# Patient Record
Sex: Male | Born: 1965 | Race: White | Hispanic: No | Marital: Married | Smoking: Current every day smoker
Health system: Southern US, Community
[De-identification: ages and names within clinical notes are randomized; demographics above are authoritative.]

## PROBLEM LIST (undated history)

## (undated) DIAGNOSIS — E7800 Pure hypercholesterolemia, unspecified: Secondary | ICD-10-CM

## (undated) DIAGNOSIS — F17210 Nicotine dependence, cigarettes, uncomplicated: Secondary | ICD-10-CM

## (undated) DIAGNOSIS — K429 Umbilical hernia without obstruction or gangrene: Secondary | ICD-10-CM

## (undated) DIAGNOSIS — I10 Essential (primary) hypertension: Secondary | ICD-10-CM

## (undated) DIAGNOSIS — Z87442 Personal history of urinary calculi: Secondary | ICD-10-CM

## (undated) HISTORY — PX: TONSILLECTOMY: SUR1361

## (undated) HISTORY — DX: Nicotine dependence, cigarettes, uncomplicated: F17.210

## (undated) HISTORY — DX: Umbilical hernia without obstruction or gangrene: K42.9

---

## 1996-02-27 HISTORY — PX: FLEXIBLE SIGMOIDOSCOPY: SHX1649

## 2014-05-06 DIAGNOSIS — I10 Essential (primary) hypertension: Secondary | ICD-10-CM | POA: Insufficient documentation

## 2014-10-08 DIAGNOSIS — M722 Plantar fascial fibromatosis: Secondary | ICD-10-CM | POA: Insufficient documentation

## 2014-10-08 DIAGNOSIS — M84376A Stress fracture, unspecified foot, initial encounter for fracture: Secondary | ICD-10-CM | POA: Insufficient documentation

## 2015-02-18 ENCOUNTER — Ambulatory Visit
Admission: EM | Admit: 2015-02-18 | Discharge: 2015-02-18 | Payer: 59 | Attending: Family Medicine | Admitting: Family Medicine

## 2015-02-18 ENCOUNTER — Emergency Department
Admission: EM | Admit: 2015-02-18 | Discharge: 2015-02-18 | Disposition: A | Discharge status: Home / Self Care | Payer: 59 | Attending: Emergency Medicine | Admitting: Emergency Medicine

## 2015-02-18 ENCOUNTER — Encounter: Payer: Self-pay | Admitting: Emergency Medicine

## 2015-02-18 ENCOUNTER — Emergency Department: Payer: 59

## 2015-02-18 DIAGNOSIS — I10 Essential (primary) hypertension: Secondary | ICD-10-CM | POA: Insufficient documentation

## 2015-02-18 DIAGNOSIS — S29011A Strain of muscle and tendon of front wall of thorax, initial encounter: Secondary | ICD-10-CM | POA: Insufficient documentation

## 2015-02-18 DIAGNOSIS — X58XXXA Exposure to other specified factors, initial encounter: Secondary | ICD-10-CM | POA: Diagnosis not present

## 2015-02-18 DIAGNOSIS — Z8249 Family history of ischemic heart disease and other diseases of the circulatory system: Secondary | ICD-10-CM | POA: Insufficient documentation

## 2015-02-18 DIAGNOSIS — S29019A Strain of muscle and tendon of unspecified wall of thorax, initial encounter: Secondary | ICD-10-CM

## 2015-02-18 DIAGNOSIS — R071 Chest pain on breathing: Secondary | ICD-10-CM | POA: Diagnosis not present

## 2015-02-18 DIAGNOSIS — R079 Chest pain, unspecified: Secondary | ICD-10-CM | POA: Insufficient documentation

## 2015-02-18 DIAGNOSIS — Z720 Tobacco use: Secondary | ICD-10-CM | POA: Diagnosis not present

## 2015-02-18 DIAGNOSIS — Z7982 Long term (current) use of aspirin: Secondary | ICD-10-CM | POA: Insufficient documentation

## 2015-02-18 DIAGNOSIS — Y998 Other external cause status: Secondary | ICD-10-CM | POA: Insufficient documentation

## 2015-02-18 DIAGNOSIS — Y9389 Activity, other specified: Secondary | ICD-10-CM | POA: Insufficient documentation

## 2015-02-18 DIAGNOSIS — E7800 Pure hypercholesterolemia, unspecified: Secondary | ICD-10-CM | POA: Diagnosis not present

## 2015-02-18 DIAGNOSIS — Y9289 Other specified places as the place of occurrence of the external cause: Secondary | ICD-10-CM | POA: Diagnosis not present

## 2015-02-18 DIAGNOSIS — F17210 Nicotine dependence, cigarettes, uncomplicated: Secondary | ICD-10-CM | POA: Insufficient documentation

## 2015-02-18 DIAGNOSIS — Z79899 Other long term (current) drug therapy: Secondary | ICD-10-CM | POA: Diagnosis not present

## 2015-02-18 DIAGNOSIS — R0789 Other chest pain: Secondary | ICD-10-CM

## 2015-02-18 DIAGNOSIS — S299XXA Unspecified injury of thorax, initial encounter: Secondary | ICD-10-CM | POA: Diagnosis present

## 2015-02-18 HISTORY — DX: Pure hypercholesterolemia, unspecified: E78.00

## 2015-02-18 HISTORY — DX: Essential (primary) hypertension: I10

## 2015-02-18 LAB — BASIC METABOLIC PANEL
ANION GAP: 8 (ref 5–15)
BUN: 10 mg/dL (ref 6–20)
CALCIUM: 9.4 mg/dL (ref 8.9–10.3)
CO2: 26 mmol/L (ref 22–32)
Chloride: 106 mmol/L (ref 101–111)
Creatinine, Ser: 1.08 mg/dL (ref 0.61–1.24)
Glucose, Bld: 121 mg/dL — ABNORMAL HIGH (ref 65–99)
POTASSIUM: 4.3 mmol/L (ref 3.5–5.1)
SODIUM: 140 mmol/L (ref 135–145)

## 2015-02-18 LAB — CBC
HEMATOCRIT: 42.8 % (ref 40.0–52.0)
HEMOGLOBIN: 14.8 g/dL (ref 13.0–18.0)
MCH: 31.8 pg (ref 26.0–34.0)
MCHC: 34.5 g/dL (ref 32.0–36.0)
MCV: 92.3 fL (ref 80.0–100.0)
Platelets: 187 10*3/uL (ref 150–440)
RBC: 4.64 MIL/uL (ref 4.40–5.90)
RDW: 13 % (ref 11.5–14.5)
WBC: 6 10*3/uL (ref 3.8–10.6)

## 2015-02-18 LAB — TROPONIN I

## 2015-02-18 MED ORDER — NITROGLYCERIN 0.4 MG SL SUBL
0.4000 mg | SUBLINGUAL_TABLET | SUBLINGUAL | Status: DC | PRN
  Administered 2015-02-18 (×2): 0.4 mg via SUBLINGUAL

## 2015-02-18 MED ORDER — ALBUTEROL SULFATE HFA 108 (90 BASE) MCG/ACT IN AERS: 2.0000 | INHALATION_SPRAY | Freq: Four times a day (QID) | RESPIRATORY_TRACT | Status: DC | PRN

## 2015-02-18 NOTE — ED Notes (Signed)
Woke at 6am today with left anterior chest pain radiating through to back. Denies diaphoretic, no SOB. Worsening pain with inspiration. Color pink, skin warm and dry. Hx of 1 1/2 PPD smoker x 30 years

## 2015-02-18 NOTE — ED Notes (Signed)
[facility], [facility] was sent [facility]. Patient presented there this morning after having chest pain that started at 6am, patient reports that he was lightheaded when he had the chest pain but did not have any other symptoms. Patient states that the pain is positional and that it hurts when he coughs. Patient states that he has been coughing more for the last 1-2 weeks.   Per EMS, patients vital signs were stable and 12 lead from [facility] was W[facility].

## 2015-02-18 NOTE — ED Provider Notes (Signed)
[facility] Emergency Department Provider Note   ____________________________________________  Time seen:  I have reviewed the triage vital signs and the triage nursing note.  HISTORY  Chief Complaint Chest Pain   Historian Patient  HPI Gavin RomansLyman Jimenez is a 49 y.o. male who is sent here from urgent care where he attempted to seek evaluation for left sided chest and back pain. Chest pain started this morning after he coughed hard. He states he has been coughing recently, and thinks that this may be related to smoking. He is attempting to quit smoking by using Wellbutrin. He thinks he has wheezed at point in the past, however he has never used an inhaler. Pain is moderate in the left back when he coughs especially, and when he takes a deep breath. He's not had a fever. He's had no nausea or vomiting. No palpitations.    Past Medical History  Diagnosis Date  . Hypertension   . Hypercholesteremia     There are no active problems to display for this patient.   Past Surgical History  Procedure Laterality Date  . Tonsillectomy      Current Outpatient Rx  Name  Route  Sig  Dispense  Refill  . aspirin EC 325 MG tablet   Oral   Take 650 mg by mouth once.         Marland Kitchen. buPROPion (WELLBUTRIN SR) 150 MG 12 hr tablet   Oral   Take 150 mg by mouth 2 (two) times daily.         . cholecalciferol (VITAMIN D) 1000 UNITS tablet   Oral   Take 5,000 Units by mouth daily.         Marland Kitchen. lisinopril (PRINIVIL,ZESTRIL) 10 MG tablet   Oral   Take 10 mg by mouth daily.         . pravastatin (PRAVACHOL) 20 MG tablet   Oral   Take 20 mg by mouth daily.         Marland Kitchen. albuterol (PROVENTIL HFA;VENTOLIN HFA) 108 (90 BASE) MCG/ACT inhaler   Inhalation   Inhale 2 puffs into the lungs every 6 (six) hours as needed for wheezing or shortness of breath.   1 Inhaler   0     Allergies Review of patient's allergies indicates no known allergies.  Family History  Problem  Relation Age of Onset  . Heart attack Mother   . Heart attack Father     Social History Social History  Substance Use Topics  . Smoking status: Current Every Day Smoker -- 2.00 packs/day    Types: Cigarettes  . Smokeless tobacco: None  . Alcohol Use: Yes     Comment: 3-4 beers daily    Review of Systems  Constitutional: Negative for fever. Eyes: Negative for visual changes. ENT: Negative for sore throat. Cardiovascular: Negative for palpitations. Respiratory: Negative for shortness of breath. Gastrointestinal: Negative for abdominal pain, vomiting and diarrhea. Genitourinary: Negative for dysuria. Musculoskeletal: Negative for extremity pain. Skin: Negative for rash. Neurological: Negative for headache. 10 point Review of Systems otherwise negative ____________________________________________   PHYSICAL EXAM:  VITAL SIGNS: ED Triage Vitals  Enc Vitals Group     BP 02/18/15 0913 124/79 mmHg     Pulse Rate 02/18/15 0913 78     Resp 02/18/15 0913 15     Temp 02/18/15 0913 98.1 F (36.7 C)     Temp Source 02/18/15 0913 Oral     SpO2 02/18/15 0913 98 %     Weight  02/18/15 0913 198 lb 12.8 oz (90.175 kg)     Height 02/18/15 0913  (1.778 m)     Head Cir --      Peak Flow --      Pain Score 02/18/15 0915 4     Pain Loc --      Pain Edu? --      Excl. in GC? --      Constitutional: Alert and oriented. Well appearing and in no distress. Eyes: Conjunctivae are normal. PERRL. Normal extraocular movements. ENT   Head: Normocephalic and atraumatic.   Nose: No congestion/rhinnorhea.   Mouth/Throat: Mucous membranes are moist.   Neck: No stridor. Cardiovascular/Chest: Normal rate, regular rhythm.  No murmurs, rubs, or gallops. Respiratory: Normal respiratory effort without tachypnea nor retractions. Mildly tight breath sounds, with mild rhonchi posteriorly. Gastrointestinal: Soft. No distention, no guarding, no rebound. Nontender   Genitourinary/rectal:Deferred Musculoskeletal: Nontender with normal range of motion in all extremities. No joint effusions.  No lower extremity tenderness.  No edema. Neurologic:  Normal speech and language. No gross or focal neurologic deficits are appreciated. Skin:  Skin is warm, dry and intact. No rash noted. Psychiatric: Mood and affect are normal. Speech and behavior are normal. Patient exhibits appropriate insight and judgment.  ____________________________________________   EKG I, Governor Rooks, MD, the attending physician have personally viewed and interpreted all ECGs.  74 bpm. Narrow QRS. Normal sinus rhythm. Normal axis. Normal ST and T-wave. ____________________________________________  LABS (pertinent positives/negatives)  Basic metabolic panel within normal limits CBC within normal limits Troponin less than 0.03  ____________________________________________  RADIOLOGY All Xrays were viewed by me. Imaging interpreted by Radiologist.  Chest x-ray portable one view bibasilar atelectasis __________________________________________  PROCEDURES  Procedure(s) performed: None  Critical Care performed: None  ____________________________________________   ED COURSE / ASSESSMENT AND PLAN  CONSULTATIONS: None  Pertinent labs & imaging results that were available during my care of the patient were reviewed by me and considered in my medical decision making (see chart for details).   Patient's symptoms seem most consistent with musculoskeletal low chest/back pain after cough. I suspect a thoracic strain after a strong cough. My suspicion for cardiac etiology is extremely low. His EKG is reassuring as is his laboratory evaluation including a negative troponin drawn greater than 4 hours after the onset of his pain. Given the fact that he is a smoker and has a family history of family members with heart attacks in the 46s to 77s, I will go ahead and refer him to  cardiologist for outpatient follow-up. He does have some minor bronchospasm on exam, I am going to prescribe him an albuterol inhaler.  Patient / Family / Caregiver informed of clinical course, medical decision-making process, and agree with plan.   I discussed return precautions, follow-up instructions, and discharged instructions with patient and/or family.  ___________________________________________   FINAL CLINICAL IMPRESSION(S) / ED DIAGNOSES   Final diagnoses:  Thoracic myofascial strain, initial encounter  Chest wall pain       Governor Rooks, MD 02/18/15 1208

## 2015-02-18 NOTE — ED Provider Notes (Signed)
CSN: [id number]     Arrival date & time 02/18/15  [id number] History   None    Chief Complaint  Patient presents with  . Chest Pain   (Consider location/radiation/quality/duration/timing/severity/associated sxs/prior Treatment) HPI patient presents today with symptoms of left anterior chest pain radiating to the back which started early this morning when he woke up. Patient admits to some dizziness but denies any diaphoresis or shortness of breath or nausea or vomiting. Patient does have history of hypertension and high cholesterol. Patient is a smoker and does drink alcohol daily. He admits to having reflux in the past and that the symptoms that he currently has are not similar. He denies any cardiac history in the past. He does have a strong family history of CAD.  Past Medical History  Diagnosis Date  . Hypertension   . Hypercholesteremia    Past Surgical History  Procedure Laterality Date  . Tonsillectomy     Family History  Problem Relation Age of Onset  . Heart attack Mother   . Heart attack Father    Social History  Substance Use Topics  . Smoking status: Current Every Day Smoker -- 2.00 packs/day    Types: Cigarettes  . Smokeless tobacco: None  . Alcohol Use: Yes     Comment: 3-4 beers daily    Review of Systems: Negative except mentioned above.  Allergies  Review of patient's allergies indicates no known allergies.  Home Medications   Prior to Admission medications   Medication Sig Start Date End Date Taking? Authorizing Provider  aspirin EC 325 MG tablet Take 650 mg by mouth once.   Yes Historical Provider, MD  buPROPion (WELLBUTRIN SR) 150 MG 12 hr tablet Take 150 mg by mouth 2 (two) times daily.   Yes Historical Provider, MD  cholecalciferol (VITAMIN D) 1000 UNITS tablet Take 5,000 Units by mouth daily.   Yes Historical Provider, MD  lisinopril (PRINIVIL,ZESTRIL) 10 MG tablet Take 10 mg by mouth daily.   Yes Historical Provider, MD  pravastatin (PRAVACHOL) 20 MG  tablet Take 20 mg by mouth daily.   Yes Historical Provider, MD   Meds Ordered and Administered this Visit  Medications - No data to display  BP 159/90 mmHg  Pulse 86  Temp(Src) 97.6 F (36.4 C) (Tympanic)  Resp 16  Ht 5\' 10"  (1.778 m)  Wt 203 lb (92.08 kg)  BMI 29.13 kg/m2  SpO2 97% No data found.   Physical Exam   GENERAL: NAD HEENT: no pharyngeal erythema, no exudate RESP: CTA B CARD: RRR, pain not reproducible  ABD: soft, NT/ND EXTREM: -Homans  NEURO: CN II-XII grossly intact   ED Course  Procedures (including critical care time)  Labs Review Labs Reviewed - No data to display  Imaging Review No results found.   ECG shows NSR, no ST elevation or depression, no T wave inversions     MDM   A/P: Chest Pain- ECG shows NSR with no ST elevation/depression or T wave inversions, given patient's symptoms, medical history and family history I would recommend further evaluation and treatment at the ER. Patient took two 325mg  ASA before coming in this morning. An IV was started, O2 and one sublingual nitroglycerin was given to the patient in the office. The one SL Nitro did help the patient's pain. EMS was called for transport.    Jolene ProvostKirtida Nicholos Aloisi, MD 02/18/15 [phone]

## 2015-02-18 NOTE — ED Notes (Signed)
Patient states that he took two 325mg  Aspirin at home [facility].  [facility] he was given 1 SL NTG, per patient NTG did have some relief from the NTG.

## 2015-02-18 NOTE — Discharge Instructions (Signed)
You were evaluated for left-sided chest/back pain which occurred after coughing, and your exam and evaluation are reassuring here in the emergency department. I suspect you pulled a muscle with the cough.  We discussed, that since you have a history of family members with early/young heart attacks, as well as are a smoker, I will recommend you to follow-up with a cardiologist for consideration of further testing such as a stress test.  You are also referred for primary care follow-up within one week, and referred to [facility] clinic.  Return to the emergency department for any new or worsening condition including chest pain, palpitations, dizziness, passing out, left jaw or left arm pain, nausea, sweating, or any other symptoms concerning to you.  Treat musculoskeletal pain with ibuprofen 600 mg over-the-counter every 8 hours as needed for pain.   Chest Wall Pain Chest wall pain is pain in or around the bones and muscles of your chest. Sometimes, an injury causes this pain. Sometimes, the cause may not be known. This pain may take several weeks or longer to get better. HOME CARE Pay attention to any changes in your symptoms. Take these actions to help with your pain:  Rest as told by your doctor.  Avoid activities that cause pain. Try not to use your chest, belly (abdominal), or side muscles to lift heavy things.  If directed, apply ice to the painful area:  Put ice in a plastic bag.  Place a towel between your skin and the bag.  Leave the ice on for 20 minutes, 2-3 times per day.  Take over-the-counter and prescription medicines only as told by your doctor.  Do not use tobacco products, including cigarettes, chewing tobacco, and e-cigarettes. If you need help quitting, ask your doctor.  Keep all follow-up visits as told by your doctor. This is important. GET HELP IF:  You have a fever.  Your chest pain gets worse.  You have new symptoms. GET HELP RIGHT AWAY IF:  You feel  sick to your stomach (nauseous) or you throw up (vomit).  You feel sweaty or light-headed.  You have a cough with phlegm (sputum) or you cough up blood.  You are short of breath.   This information is not intended to replace advice given to you by your health care provider. Make sure you discuss any questions you have with your health care provider.   Document Released: 10/05/2007 Document Revised: 01/07/2015 Document Reviewed: 07/14/2014 Elsevier Interactive Patient Education 2016 [employer]  Nonspecific Chest Pain  Chest pain can be caused by many different conditions. There is always a chance that your pain could be related to something serious, such as a heart attack or a blood clot in your lungs. Chest pain can also be caused by conditions that are not life-threatening. If you have chest pain, it is very important to follow up with your health care provider. CAUSES  Chest pain can be caused by:  Heartburn.  Pneumonia or bronchitis.  Anxiety or stress.  Inflammation around your heart (pericarditis) or lung (pleuritis or pleurisy).  A blood clot in your lung.  A collapsed lung (pneumothorax). It can develop suddenly on its own (spontaneous pneumothorax) or from trauma to the chest.  Shingles infection (varicella-zoster virus).  Heart attack.  Damage to the bones, muscles, and cartilage that make up your chest wall. This can include:  Bruised bones due to injury.  Strained muscles or cartilage due to frequent or repeated coughing or overwork.  Fracture to one or more  ribs.  Sore cartilage due to inflammation (costochondritis). RISK FACTORS  Risk factors for chest pain may include:  Activities that increase your risk for trauma or injury to your chest.  Respiratory infections or conditions that cause frequent coughing.  Medical conditions or overeating that can cause heartburn.  Heart disease or family history of heart disease.  Conditions or health  behaviors that increase your risk of developing a blood clot.  Having had chicken pox (varicella zoster). SIGNS AND SYMPTOMS Chest pain can feel like:  Burning or tingling on the surface of your chest or deep in your chest.  Crushing, pressure, aching, or squeezing pain.  Dull or sharp pain that is worse when you move, cough, or take a deep breath.  Pain that is also felt in your back, neck, shoulder, or arm, or pain that spreads to any of these areas. Your chest pain may come and go, or it may stay constant. DIAGNOSIS Lab tests or other studies may be needed to find the cause of your pain. Your health care provider may have you take a test called an ambulatory ECG (electrocardiogram). An ECG records your heartbeat patterns at the time the test is performed. You may also have other tests, such as:  Transthoracic echocardiogram (TTE). During echocardiography, sound waves are used to create a picture of all of the heart structures and to look at how blood flows through your heart.  Transesophageal echocardiogram (TEE).This is a more advanced imaging test that obtains images from inside your body. It allows your health care provider to see your heart in finer detail.  Cardiac monitoring. This allows your health care provider to monitor your heart rate and rhythm in real time.  Holter monitor. This is a portable device that records your heartbeat and can help to diagnose abnormal heartbeats. It allows your health care provider to track your heart activity for several days, if needed.  Stress tests. These can be done through exercise or by taking medicine that makes your heart beat more quickly.  Blood tests.  Imaging tests. TREATMENT  Your treatment depends on what is causing your chest pain. Treatment may include:  Medicines. These may include:  Acid blockers for heartburn.  Anti-inflammatory medicine.  Pain medicine for inflammatory conditions.  Antibiotic medicine, if an  infection is present.  Medicines to dissolve blood clots.  Medicines to treat coronary artery disease.  Supportive care for conditions that do not require medicines. This may include:  Resting.  Applying heat or cold packs to injured areas.  Limiting activities until pain decreases. HOME CARE INSTRUCTIONS  If you were prescribed an antibiotic medicine, finish it all even if you start to feel better.  Avoid any activities that bring on chest pain.  Do not use any tobacco products, including cigarettes, chewing tobacco, or electronic cigarettes. If you need help quitting, ask your health care provider.  Do not drink alcohol.  Take medicines only as directed by your health care provider.  Keep all follow-up visits as directed by your health care provider. This is important. This includes any further testing if your chest pain does not go away.  If heartburn is the cause for your chest pain, you may be told to keep your head raised (elevated) while sleeping. This reduces the chance that acid will go from your stomach into your esophagus.  Make lifestyle changes as directed by your health care provider. These may include:  Getting regular exercise. Ask your health care provider to suggest some activities  that are safe for you.  Eating a heart-healthy diet. A registered dietitian can help you to learn healthy eating options.  Maintaining a healthy weight.  Managing diabetes, if necessary.  Reducing stress. SEEK MEDICAL CARE IF:  Your chest pain does not go away after treatment.  You have a rash with blisters on your chest.  You have a fever. SEEK IMMEDIATE MEDICAL CARE IF:   Your chest pain is worse.  You have an increasing cough, or you cough up blood.  You have severe abdominal pain.  You have severe weakness.  You faint.  You have chills.  You have sudden, unexplained chest discomfort.  You have sudden, unexplained discomfort in your arms, back, neck, or  jaw.  You have shortness of breath at any time.  You suddenly start to sweat, or your skin gets clammy.  You feel nauseous or you vomit.  You suddenly feel light-headed or dizzy.  Your heart begins to beat quickly, or it feels like it is skipping beats. These symptoms may represent a serious problem that is an emergency. Do not wait to see if the symptoms will go away. Get medical help right away. Call your local emergency services (911 in the [place] Do not drive yourself to the hospital.   This information is not intended to replace advice given to you by your health care provider. Make sure you discuss any questions you have with your health care provider.   Document Released: 01/26/2005 Document Revised: 05/09/2014 Document Reviewed: 11/22/2013 Elsevier Interactive Patient Education [employer]

## 2015-07-16 DIAGNOSIS — F17210 Nicotine dependence, cigarettes, uncomplicated: Secondary | ICD-10-CM | POA: Insufficient documentation

## 2015-07-16 DIAGNOSIS — E7800 Pure hypercholesterolemia, unspecified: Secondary | ICD-10-CM | POA: Insufficient documentation

## 2016-08-24 DIAGNOSIS — N522 Drug-induced erectile dysfunction: Secondary | ICD-10-CM | POA: Insufficient documentation

## 2016-11-30 ENCOUNTER — Other Ambulatory Visit: Payer: Self-pay

## 2016-11-30 DIAGNOSIS — K4020 Bilateral inguinal hernia, without obstruction or gangrene, not specified as recurrent: Secondary | ICD-10-CM | POA: Insufficient documentation

## 2016-12-01 ENCOUNTER — Ambulatory Visit: Payer: 59 | Admitting: Surgery

## 2016-12-12 ENCOUNTER — Ambulatory Visit (INDEPENDENT_AMBULATORY_CARE_PROVIDER_SITE_OTHER): Payer: 59 | Admitting: Surgery

## 2016-12-12 ENCOUNTER — Encounter: Payer: Self-pay | Admitting: Surgery

## 2016-12-12 VITALS — BP 178/107 | HR 69 | Temp 98.5°F | Ht 70.0 in | Wt 199.4 lb

## 2016-12-12 DIAGNOSIS — K429 Umbilical hernia without obstruction or gangrene: Secondary | ICD-10-CM | POA: Diagnosis not present

## 2016-12-12 NOTE — Patient Instructions (Signed)
You have seen Dr. Excell Seltzerooper today in regards to your umbilical hernia.  Please call our office if you would like to have this repaired in the future.  If you decide to have your hernia repaired, You will need to arrange to be off work for 1-2 weeks but will have to have a lifting restriction of no more than 15 lbs for 6 weeks following your surgery.   Umbilical Hernia, Adult A hernia is a bulge of tissue that pushes through an opening between muscles. An umbilical hernia happens in the abdomen, near the belly button (umbilicus). The hernia may contain tissues from the small intestine, large intestine, or fatty tissue covering the intestines (omentum). Umbilical hernias in adults tend to get worse over time, and they require surgical treatment. There are several types of umbilical hernias. You may have:  A hernia located just above or below the umbilicus (indirect hernia). This is the most common type of umbilical hernia in adults.  A hernia that forms through an opening formed by the umbilicus (direct hernia).  A hernia that comes and goes (reducible hernia). A reducible hernia may be visible only when you strain, lift something heavy, or cough. This type of hernia can be pushed back into the abdomen (reduced).  A hernia that traps abdominal tissue inside the hernia (incarcerated hernia). This type of hernia cannot be reduced.  A hernia that cuts off blood flow to the tissues inside the hernia (strangulated hernia). The tissues can start to die if this happens. This type of hernia requires emergency treatment.  What are the causes? An umbilical hernia happens when tissue inside the abdomen presses on a weak area of the abdominal muscles. What increases the risk? You may have a greater risk of this condition if you:  Are obese.  Have had several pregnancies.  Have a buildup of fluid inside your abdomen (ascites).  Have had surgery that weakens the abdominal muscles.  What are the signs  or symptoms? The main symptom of this condition is a painless bulge at or near the belly button. A reducible hernia may be visible only when you strain, lift something heavy, or cough. Other symptoms may include:  Dull pain.  A feeling of pressure.  Symptoms of a strangulated hernia may include:  Pain that gets increasingly worse.  Nausea and vomiting.  Pain when pressing on the hernia.  Skin over the hernia becoming red or purple.  Constipation.  Blood in the stool.  How is this diagnosed? This condition may be diagnosed based on:  A physical exam. You may be asked to cough or strain while standing. These actions increase the pressure inside your abdomen and force the hernia through the opening in your muscles. Your health care provider may try to reduce the hernia by pressing on it.  Your symptoms and medical history.  How is this treated? Surgery is the only treatment for an umbilical hernia. Surgery for a strangulated hernia is done as soon as possible. If you have a small hernia that is not incarcerated, you may need to lose weight before having surgery. Follow these instructions at home:  Lose weight, if told by your health care provider.  Do not try to push the hernia back in.  Watch your hernia for any changes in color or size. Tell your health care provider if any changes occur.  You may need to avoid activities that increase pressure on your hernia.  Do not lift anything that is heavier than 10  lb (4.5 kg) until your health care provider says that this is safe.  Take over-the-counter and prescription medicines only as told by your health care provider.  Keep all follow-up visits as told by your health care provider. This is important. Contact a health care provider if:  Your hernia gets larger.  Your hernia becomes painful. Get help right away if:  You develop sudden, severe pain near the area of your hernia.  You have pain as well as nausea or  vomiting.  You have pain and the skin over your hernia changes color.  You develop a fever. This information is not intended to replace advice given to you by your health care provider. Make sure you discuss any questions you have with your health care provider. Document Released: 09/18/2015 Document Revised: 12/20/2015 Document Reviewed: 09/18/2015 Elsevier Interactive Patient Education  [employer].

## 2016-12-12 NOTE — Progress Notes (Signed)
Gavin Jimenez is an 51 y.o. male.   Chief Complaint: Umb hernia HP this patient with an umbilical hernia has been bothering him for about 6 months but does not cause him much pain he's never had nausea vomiting or signs of incarceration. Denies fevers or chills.  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 Hypercholesteremia   . Hypertension     Past Surgical History:  Procedure Laterality Date  . FLEXIBLE SIGMOIDOSCOPY  02/27/1996  . TONSILLECTOMY      Family History  Problem Relation Age of Onset  . Heart attack Mother   . Diabetes Mellitus II Mother   . Osteoarthritis Mother   . Obesity Mother   . Heart attack Father   . CAD Father   . Alcohol abuse Father   . Hyperlipidemia Father   . Hypertension Father   . Osteoarthritis Father   . Hyperlipidemia Maternal Grandmother   . Hypertension Maternal Grandmother   . Obesity Maternal Grandmother   . CAD Maternal Grandmother   . Glaucoma Paternal Grandfather    Social History:  reports that he has been smoking Cigarettes.  He has been smoking about 2.00 packs per day. He has never used smokeless tobacco. He reports that he drinks alcohol. He reports that he does not use drugs.  Allergies:  Allergies  Allergen Reactions  . Nickel Rash     (Not in a hospital admission)   Review of Systems:   Review of Systems  Constitutional: Negative.   HENT: Negative.   Eyes: Negative.   Respiratory: Negative.   Cardiovascular: Negative.   Gastrointestinal: Negative for abdominal pain, constipation, diarrhea, heartburn, nausea and vomiting.  Genitourinary: Negative.   Musculoskeletal: Negative.   Skin: Negative.   Neurological: Negative.    Endo/Heme/Allergies: Negative.   Psychiatric/Behavioral: Negative.     Physical Exam:  Physical Exam  Constitutional: He is oriented to person, place, and time and well-developed, well-nourished, and in no distress. No distress.  HENT:  Head: Normocephalic and atraumatic.  Eyes: Pupils are equal, round, and reactive to light. Right eye exhibits no discharge. Left eye exhibits no discharge. No scleral icterus.  Neck: Normal range of motion.  Cardiovascular: Normal rate, regular rhythm and normal heart sounds.   Pulmonary/Chest: Effort normal and breath sounds normal. No respiratory distress. He has no wheezes. He has no rales.  Abdominal: Soft. He exhibits no distension. There is no tenderness. There is no rebound.  Reducible nontender umbilical hernia  Musculoskeletal: Normal range of motion. He exhibits no edema or tenderness.  Lymphadenopathy:    He has no cervical adenopathy.  Neurological: He is alert and oriented to person, place, and time.  Skin: Skin is warm and dry. No rash noted. He is not diaphoretic. No erythema.  Psychiatric: Mood and affect normal.  Vitals reviewed.   There were no vitals taken for this visit.    No results found for this or any previous visit (from the past 48 hour(s)). No results found.   Assessment/Plan Small umbilical hernia which is reducible and nontender would recommend repair with mesh. I discussed with him the options of observation rationale for repair likely with mesh and the risk of bleeding infection recurrence  cosmetic deformity as well as infected mesh . We discussed these risks in detail and the options. The patient had multiple questions about mesh apparently his mother had a fistula with a mesh repair at one point. He wants to do some further research on this and has not yet decided if he wants to have surgery. I reminded him of the risk of incarceration or emergency surgery and he will call us with his decision      Gavin Jimenez Gavin  [name], MD, FACS

## 2017-01-02 IMAGING — CR DG CHEST 1V PORT
1 series · 1 of 1 positions shown · non-contrast
Comparison: None.

CLINICAL DATA: Chest pain beginning this morning.  Lightheadedness.

EXAM:
PORTABLE CHEST 1 VIEW

[ap]
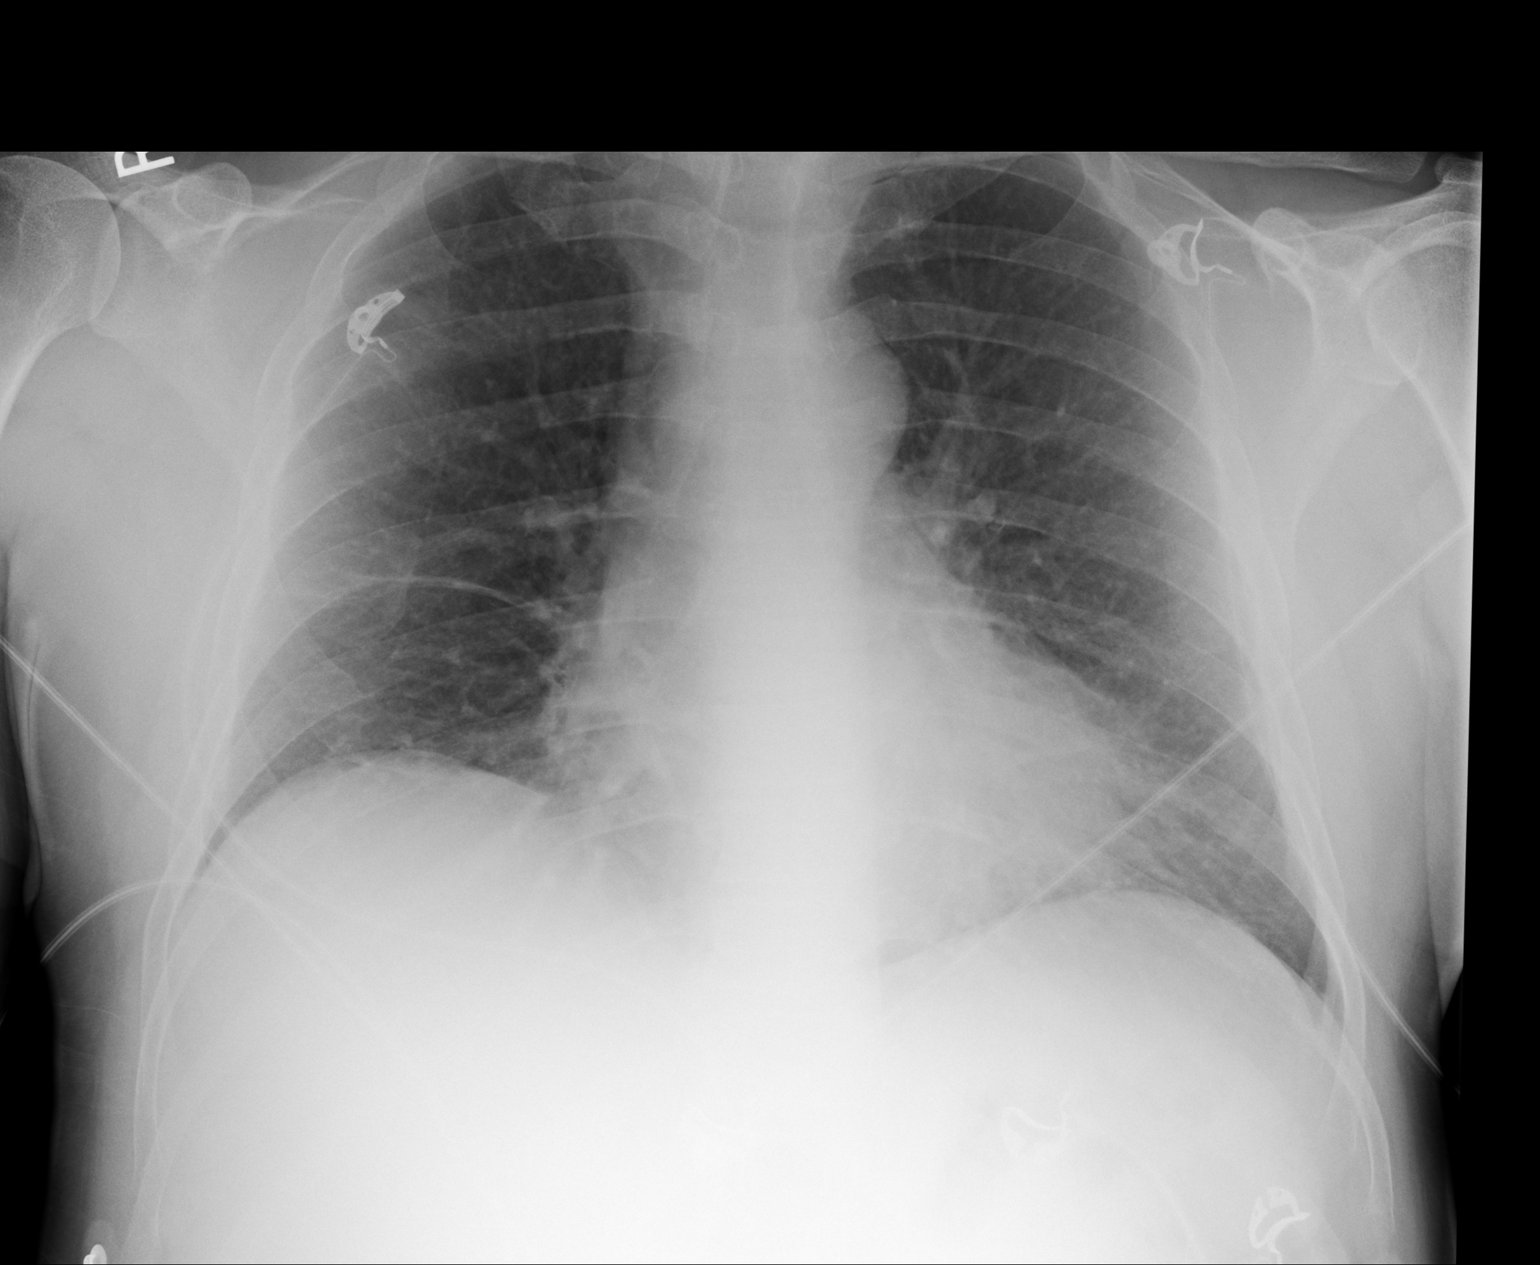

[1 of 1 positions shown; findings below may reference images not displayed]

FINDINGS: There is bibasilar atelectasis. Heart is normal size. No effusions.
No acute bony abnormality.
IMPRESSION: Bibasilar atelectasis.

## 2019-05-03 HISTORY — PX: COLONOSCOPY: SHX174

## 2021-03-31 ENCOUNTER — Ambulatory Visit: Payer: Self-pay | Admitting: Surgery

## 2021-04-05 ENCOUNTER — Encounter: Payer: Self-pay | Admitting: Surgery

## 2021-04-05 ENCOUNTER — Ambulatory Visit (INDEPENDENT_AMBULATORY_CARE_PROVIDER_SITE_OTHER): Payer: Commercial Managed Care - PPO | Admitting: Surgery

## 2021-04-05 ENCOUNTER — Other Ambulatory Visit: Payer: Self-pay

## 2021-04-05 VITALS — BP 137/76 | HR 94 | Temp 98.7°F | Ht 69.0 in | Wt 202.0 lb

## 2021-04-05 DIAGNOSIS — K420 Umbilical hernia with obstruction, without gangrene: Secondary | ICD-10-CM

## 2021-04-05 NOTE — Progress Notes (Signed)
04/05/2021  Reason for Visit:  Umbilical hernia  Requesting Provider:  Barbaraann Boys, MD  History of Present Illness: Gavin Jimenez is a 55 y.o. male presenting for evaluation of an umbilical hernia.  The patient was previously seen by Dr. Burt Knack in 11/2016 for the same, but no surgery was scheduled.  Patient reports that over the past few years he has noted that perhaps the hernia is larger in size, but now it's also not reducible.  Previously he was able to push the hernia back inside himself, but now it stays bulging.  Denies any significant discomfort or symptoms from it, but he would like to proceed with surgery this time.  He mentioned that his mother had a hernia repair with mesh as well in the past, and she had a complication of a fistula.  He reports that she had had multiple surgeries prior to it.  On the other hand, he has not had any abdominal surgeries in the past.  Past Medical History: Past Medical History:  Diagnosis Date   Cigarette smoker    Hypercholesteremia    Hypertension    Umbilical hernia      Past Surgical History: Past Surgical History:  Procedure Laterality Date   FLEXIBLE SIGMOIDOSCOPY  02/27/1996   TONSILLECTOMY      Home Medications: Prior to Admission medications   Medication Sig Start Date End Date Taking? Authorizing Provider  amLODipine (NORVASC) 5 MG tablet Take 1 tablet by mouth 1 day or 1 dose. 08/24/16 08/24/17  [provider]    Allergies: Allergies  Allergen Reactions   Ace Inhibitors Cough   Nickel Rash    Social History:  reports that he has been smoking cigarettes. He has been smoking an average of 2 packs per day. He has never used smokeless tobacco. He reports current alcohol use. He reports that he does not use drugs.   Family History: Family History  Problem Relation Age of Onset   Heart attack Mother    Diabetes Mellitus II Mother    Osteoarthritis Mother    Obesity Mother    Heart attack Father    CAD Father     Alcohol abuse Father    Hyperlipidemia Father    Hypertension Father    Osteoarthritis Father    Hyperlipidemia Maternal Grandmother    Hypertension Maternal Grandmother    Obesity Maternal Grandmother    CAD Maternal Grandmother    Glaucoma Paternal Grandfather     Review of Systems: Review of Systems  Constitutional:  Negative for chills and fever.  HENT:  Negative for hearing loss.   Respiratory:  Negative for shortness of breath.   Cardiovascular:  Negative for chest pain.  Gastrointestinal:  Negative for abdominal pain, constipation, diarrhea, nausea and vomiting.  Genitourinary:  Negative for dysuria.  Musculoskeletal:  Negative for myalgias.  Skin:  Negative for rash.  Neurological:  Negative for dizziness.  Psychiatric/Behavioral:  Negative for depression.    Physical Exam BP 137/76   Pulse 94   Temp 98.7 F (37.1 C)   Ht 5' 9"  (1.753 m)   Wt 202 lb (91.6 kg)   SpO2 97%   BMI 29.83 kg/m  CONSTITUTIONAL: No acute distress, well nourished. HEENT:  Normocephalic, atraumatic, extraocular motion intact. NECK: Trachea is midline, and there is no jugular venous distension.  RESPIRATORY:  Lungs are clear, and breath sounds are equal bilaterally. Normal respiratory effort without pathologic use of accessory muscles. CARDIOVASCULAR: Heart is regular without murmurs, gallops, or rubs. GI:  The abdomen is soft, non-distended, non-tender to palpation.  The patient has an incarcerated, small umbilical hernia, without significant discomfort with deep palpation.  No overlying skin ulceration. MUSCULOSKELETAL:  Normal muscle strength and tone in all four extremities.  No peripheral edema or cyanosis. SKIN: Skin turgor is normal. There are no pathologic skin lesions.  NEUROLOGIC:  Motor and sensation is grossly normal.  Cranial nerves are grossly intact. PSYCH:  Alert and oriented to person, place and time. Affect is normal.  Laboratory Analysis: Labs from 01/19/21: Na 139, K 4.6,  Cl 105, CO2 24, BUN 20, Cr 1.2.  Total bili 0.7, AST 26, ALT 36, Alk phos 40, albumin 4.2.    Imaging: No results found.  Assessment and Plan: This is a 55 y.o. male with now an incarcerated umbilical hernia.  --Discussed with the patient the different categories for hernias ranging from reducible to incarcerated to strangulated.  On his last appointment with Dr. Burt Knack in 2018, it was a reducible hernia, but today it's incarcerated.  He does not have any significant symptoms.  Nonetheless, I discussed with him the recommendation for surgery to prevent any further progression.  He's in agreement.  Discussed with him the planned surgery for a robotic assisted umbilical hernia repair, and reviewed the surgery at length with him including risks of bleeding, infection, injury to surrounding structures, that this is an outpatient procedure, post-op recovery and activity restrictions, and he's willing to proceed. --Will schedule him for 05/20/21.  Will also schedule a follow up early January 2023 for H&P update.  I spent 80 minutes dedicated to the care of this patient on the date of this encounter to include pre-visit review of records, face-to-face time with the patient discussing diagnosis and management, and any post-visit coordination of care.   Melvyn Neth, [facility]

## 2021-04-05 NOTE — Patient Instructions (Addendum)
You have requested for your Umbilical Hernia be repaired. This will be scheduled with Dr. Aleen Campi at [facility].  Please see your (blue)pre-care sheet for information. Our surgery scheduler will call you to look at surgery dates and to go over information.   We will have you do a pre op exam a few weeks before surgery.   You will need to arrange to be off work for 1-2 weeks but will have to have a lifting restriction of no more than 15 lbs for 6 weeks following your surgery.  Umbilical Hernia, Adult A hernia is a bulge of tissue that pushes through an opening between muscles. An umbilical hernia happens in the abdomen, near the belly button (umbilicus). The hernia may contain tissues from the small intestine, large intestine, or fatty tissue covering the intestines. Umbilical hernias in adults tend to get worse over time, and they require surgical treatment. There are different types of umbilical hernias, including: Indirect hernia. This type is located just above or below the umbilicus. It is the most common type of umbilical hernia in adults. Direct hernia. This type forms through an opening formed by the umbilicus. Reducible hernia. This type of hernia comes and goes. It may be visible only when you strain, lift something heavy, or cough. This type of hernia can be pushed back into the abdomen (reduced). Incarcerated hernia. This type traps abdominal tissue inside the hernia. This type of hernia cannot be reduced. Strangulated hernia. This type of hernia cuts off blood flow to the tissues inside the hernia. The tissues can start to die if this happens. This type of hernia requires emergency treatment.   Umbilical Hernia, Adult A hernia is a bulge of tissue that pushes through an opening between muscles. An umbilical hernia happens in the abdomen, near the belly button (umbilicus). The hernia may contain tissues from the small intestine, large intestine, or fatty tissue covering the  intestines (omentum). Umbilical hernias in adults tend to get worse over time, and they require surgical treatment. There are several types of umbilical hernias. You may have: A hernia located just above or below the umbilicus (indirect hernia). This is the most common type of umbilical hernia in adults. A hernia that forms through an opening formed by the umbilicus (direct hernia). A hernia that comes and goes (reducible hernia). A reducible hernia may be visible only when you strain, lift something heavy, or cough. This type of hernia can be pushed back into the abdomen (reduced). A hernia that traps abdominal tissue inside the hernia (incarcerated hernia). This type of hernia cannot be reduced. A hernia that cuts off blood flow to the tissues inside the hernia (strangulated hernia). The tissues can start to die if this happens. This type of hernia requires emergency treatment.  What are the causes? An umbilical hernia happens when tissue inside the abdomen presses on a weak area of the abdominal muscles. What increases the risk? You may have a greater risk of this condition if you: Are obese. Have had several pregnancies. Have a buildup of fluid inside your abdomen (ascites). Have had surgery that weakens the abdominal muscles.  What are the signs or symptoms? The main symptom of this condition is a painless bulge at or near the belly button. A reducible hernia may be visible only when you strain, lift something heavy, or cough. Other symptoms may include: Dull pain. A feeling of pressure.  Symptoms of a strangulated hernia may include: Pain that gets increasingly worse. Nausea and vomiting.  Pain when pressing on the hernia. Skin over the hernia becoming red or purple. Constipation. Blood in the stool.  How is this diagnosed? This condition may be diagnosed based on: A physical exam. You may be asked to cough or strain while standing. These actions increase the pressure inside your  abdomen and force the hernia through the opening in your muscles. Your health care provider may try to reduce the hernia by pressing on it. Your symptoms and medical history.  How is this treated? Surgery is the only treatment for an umbilical hernia. Surgery for a strangulated hernia is done as soon as possible. If you have a small hernia that is not incarcerated, you may need to lose weight before having surgery. Follow these instructions at home: Lose weight, if told by your health care provider. Do not try to push the hernia back in. Watch your hernia for any changes in color or size. Tell your health care provider if any changes occur. You may need to avoid activities that increase pressure on your hernia. Do not lift anything that is heavier than 10 lb (4.5 kg) until your health care provider says that this is safe. Take over-the-counter and prescription medicines only as told by your health care provider. Keep all follow-up visits as told by your health care provider. This is important. Contact a health care provider if: Your hernia gets larger. Your hernia becomes painful. Get help right away if: You develop sudden, severe pain near the area of your hernia. You have pain as well as nausea or vomiting. You have pain and the skin over your hernia changes color. You develop a fever. This information is not intended to replace advice given to you by your health care provider. Make sure you discuss any questions you have with your health care provider. Document Released: 09/18/2015 Document Revised: 12/20/2015 Document Reviewed: 09/18/2015 Elsevier Interactive Patient Education  [employer].

## 2021-04-06 ENCOUNTER — Telehealth: Payer: Self-pay | Admitting: Surgery

## 2021-04-06 NOTE — Telephone Encounter (Signed)
Outgoing call is made, left message for patient to call.  Please inform patient of the following information regarding his surgery:   Pre-Admission date/time, COVID Testing date and Surgery date.  Surgery Date: 05/20/21 Preadmission Testing Date: 05/11/21 (phone 8a-1p) Covid Testing Date: Not needed.    Also patient will need to call at [phone], between 1-3:00pm the day before surgery, to find out what time to arrive for surgery.

## 2021-04-07 NOTE — Telephone Encounter (Signed)
Left another message for patient to call me so that I can give him surgery info.

## 2021-04-09 NOTE — Telephone Encounter (Signed)
Left another message for patient to call.  No return call to date.  Surgery information is mailed to the patient.

## 2021-05-10 ENCOUNTER — Other Ambulatory Visit: Payer: Self-pay

## 2021-05-10 ENCOUNTER — Ambulatory Visit (INDEPENDENT_AMBULATORY_CARE_PROVIDER_SITE_OTHER): Payer: Commercial Managed Care - PPO | Admitting: Surgery

## 2021-05-10 ENCOUNTER — Encounter: Payer: Self-pay | Admitting: Surgery

## 2021-05-10 VITALS — BP 158/91 | HR 93 | Temp 97.8°F | Ht 69.0 in | Wt 207.0 lb

## 2021-05-10 DIAGNOSIS — K420 Umbilical hernia with obstruction, without gangrene: Secondary | ICD-10-CM | POA: Diagnosis not present

## 2021-05-10 NOTE — Progress Notes (Signed)
°  05/10/2021  History of Present Illness: Gavin Jimenez is a 56 y.o. male presenting for H&P update in preparation for surgery.  He is scheduled for a robotic assisted incarcerated umbilical hernia repair on 05/20/21.  He denies any new or worsening symptoms.  His hernia remains incarcerated but without any pain or discomfort.  Denies any chest pain, shortness of breath, nausea, vomiting, constipation, diarrhea.  Past Medical History: Past Medical History:  Diagnosis Date   Cigarette smoker    Hypercholesteremia    Hypertension    Umbilical hernia      Past Surgical History: Past Surgical History:  Procedure Laterality Date   FLEXIBLE SIGMOIDOSCOPY  02/27/1996   TONSILLECTOMY      Home Medications: Prior to Admission medications   Medication Sig Start Date End Date Taking? Authorizing Provider  amLODipine (NORVASC) 10 MG tablet Take 10 mg by mouth daily. 01/21/21  Yes [provider]  buPROPion (WELLBUTRIN SR) 150 MG 12 hr tablet Take 150 mg by mouth 2 (two) times daily. 12/09/20  Yes [provider]  simvastatin (ZOCOR) 20 MG tablet Take 40 mg by mouth at bedtime. 03/26/21  Yes [provider]  valsartan (DIOVAN) 80 MG tablet Take 80 mg by mouth daily. 01/26/21  Yes [provider]    Allergies: Allergies  Allergen Reactions   Ace Inhibitors Cough   Nickel Rash    Review of Systems: Review of Systems  Constitutional:  Negative for chills and fever.  Respiratory:  Negative for shortness of breath.   Cardiovascular:  Negative for chest pain.  Gastrointestinal:  Negative for abdominal pain, nausea and vomiting.   Physical Exam BP (!) 158/91    Pulse 93    Temp 97.8 F (36.6 C)    Ht 5\' 9"  (1.753 m)    Wt 207 lb (93.9 kg)    SpO2 98%    BMI 30.57 kg/m  CONSTITUTIONAL: No acute distress, well nourished. HEENT:  Normocephalic, atraumatic, extraocular motion intact. RESPIRATORY:  Normal respiratory effort without pathologic use of accessory  muscles. CARDIOVASCULAR: Regular rhythm and rate. GI: The abdomen is soft, non-distended, non-tender to palpation.  The patient has an incarcerated umbilical hernia, no tenderness when trying to push on it.  No overlying skin changes.  Defect is probably about 2 cm in size.  NEUROLOGIC:  Motor and sensation is grossly normal.  Cranial nerves are grossly intact. PSYCH:  Alert and oriented to person, place and time. Affect is normal.  Labs/Imaging: None new  Assessment and Plan: This is a 56 y.o. male with an incarcerated umbilical hernia  --Patient is scheduled for a robotic assisted incarcerated umbilical hernia repair on 05/20/21.  There are no new findings and no new medical issues.  Discussed with him the surgery at length again and all of his questions have been answered. --He is scheduled for preop testing tomorrow.  I spent 15 minutes dedicated to the care of this patient on the date of this encounter to include pre-visit review of records, face-to-face time with the patient discussing diagnosis and management, and any post-visit coordination of care.   05/22/21, MD [facility]

## 2021-05-10 NOTE — H&P (View-Only) (Signed)
°  05/10/2021 ° °History of Present Illness: °Gavin Jimenez is a 56 y.o. male presenting for H&P update in preparation for surgery.  He is scheduled for a robotic assisted incarcerated umbilical hernia repair on 05/20/21.  He denies any new or worsening symptoms.  His hernia remains incarcerated but without any pain or discomfort.  Denies any chest pain, shortness of breath, nausea, vomiting, constipation, diarrhea. ° °Past Medical History: °Past Medical History:  °Diagnosis Date  ° Cigarette smoker   ° Hypercholesteremia   ° Hypertension   ° Umbilical hernia   °  ° °Past Surgical History: °Past Surgical History:  °Procedure Laterality Date  ° FLEXIBLE SIGMOIDOSCOPY  02/27/1996  ° TONSILLECTOMY    ° ° °Home Medications: °Prior to Admission medications   °Medication Sig Start Date End Date Taking? Authorizing Provider  °amLODipine (NORVASC) 10 MG tablet Take 10 mg by mouth daily. 01/21/21  Yes [provider]  °buPROPion (WELLBUTRIN SR) 150 MG 12 hr tablet Take 150 mg by mouth 2 (two) times daily. 12/09/20  Yes [provider]  °simvastatin (ZOCOR) 20 MG tablet Take 40 mg by mouth at bedtime. 03/26/21  Yes [provider]  °valsartan (DIOVAN) 80 MG tablet Take 80 mg by mouth daily. 01/26/21  Yes [provider]  ° ° °Allergies: °Allergies  °Allergen Reactions  ° Ace Inhibitors Cough  ° Nickel Rash  ° ° °Review of Systems: °Review of Systems  °Constitutional:  Negative for chills and fever.  °Respiratory:  Negative for shortness of breath.   °Cardiovascular:  Negative for chest pain.  °Gastrointestinal:  Negative for abdominal pain, nausea and vomiting.  ° °Physical Exam °BP (!) 158/91    Pulse 93    Temp 97.8 °F (36.6 °C)    Ht 5' 9" (1.753 m)    Wt 207 lb (93.9 kg)    SpO2 98%    BMI 30.57 kg/m²  °CONSTITUTIONAL: No acute distress, well nourished. °HEENT:  Normocephalic, atraumatic, extraocular motion intact. °RESPIRATORY:  Normal respiratory effort without pathologic use of accessory  muscles. °CARDIOVASCULAR: Regular rhythm and rate. °GI: The abdomen is soft, non-distended, non-tender to palpation.  The patient has an incarcerated umbilical hernia, no tenderness when trying to push on it.  No overlying skin changes.  Defect is probably about 2 cm in size.  °NEUROLOGIC:  Motor and sensation is grossly normal.  Cranial nerves are grossly intact. °PSYCH:  Alert and oriented to person, place and time. Affect is normal. ° °Labs/Imaging: °None new ° °Assessment and Plan: °This is a 56 y.o. male with an incarcerated umbilical hernia ° °--Patient is scheduled for a robotic assisted incarcerated umbilical hernia repair on 05/20/21.  There are no new findings and no new medical issues.  Discussed with him the surgery at length again and all of his questions have been answered. °--He is scheduled for preop testing tomorrow. ° °I spent 15 minutes dedicated to the care of this patient on the date of this encounter to include pre-visit review of records, face-to-face time with the patient discussing diagnosis and management, and any post-visit coordination of care. ° ° °Penelope Fittro Luis Trejon Duford, MD °[facility] ° ° °  °

## 2021-05-10 NOTE — Patient Instructions (Addendum)
Pre-Admission date/time, COVID Testing date and Surgery date.   Surgery Date: 05/20/21 Preadmission Testing Date: 05/11/21 (phone 8a-1p) Covid Testing Date: Not needed.     Also patient will need to call at [phone], between 1-3:00pm the day before surgery, to find out what time to arrive for surgery.  You have requested for your Umbilical Hernia be repaired. This has been scheduled on 05/20/21 by Dr. Aleen Campi at [facility]   You will need to arrange to be off work for 1-2 weeks but will have to have a lifting restriction of no more than 15 lbs for 6 weeks following your surgery.   Umbilical Hernia, Adult A hernia is a bulge of tissue that pushes through an opening between muscles. An umbilical hernia happens in the abdomen, near the belly button (umbilicus). The hernia may contain tissues from the small intestine, large intestine, or fatty tissue covering the intestines (omentum). Umbilical hernias in adults tend to get worse over time, and they require surgical treatment. There are several types of umbilical hernias. You may have: A hernia located just above or below the umbilicus (indirect hernia). This is the most common type of umbilical hernia in adults. A hernia that forms through an opening formed by the umbilicus (direct hernia). A hernia that comes and goes (reducible hernia). A reducible hernia may be visible only when you strain, lift something heavy, or cough. This type of hernia can be pushed back into the abdomen (reduced). A hernia that traps abdominal tissue inside the hernia (incarcerated hernia). This type of hernia cannot be reduced. A hernia that cuts off blood flow to the tissues inside the hernia (strangulated hernia). The tissues can start to die if this happens. This type of hernia requires emergency treatment.  What are the causes? An umbilical hernia happens when tissue inside the abdomen presses on a weak area of the abdominal muscles. What increases the  risk? You may have a greater risk of this condition if you: Are obese. Have had several pregnancies. Have a buildup of fluid inside your abdomen (ascites). Have had surgery that weakens the abdominal muscles.  What are the signs or symptoms? The main symptom of this condition is a painless bulge at or near the belly button. A reducible hernia may be visible only when you strain, lift something heavy, or cough. Other symptoms may include: Dull pain. A feeling of pressure.  Symptoms of a strangulated hernia may include: Pain that gets increasingly worse. Nausea and vomiting. Pain when pressing on the hernia. Skin over the hernia becoming red or purple. Constipation. Blood in the stool.  How is this diagnosed? This condition may be diagnosed based on: A physical exam. You may be asked to cough or strain while standing. These actions increase the pressure inside your abdomen and force the hernia through the opening in your muscles. Your health care provider may try to reduce the hernia by pressing on it. Your symptoms and medical history.  How is this treated? Surgery is the only treatment for an umbilical hernia. Surgery for a strangulated hernia is done as soon as possible. If you have a small hernia that is not incarcerated, you may need to lose weight before having surgery. Follow these instructions at home: Lose weight, if told by your health care provider. Do not try to push the hernia back in. Watch your hernia for any changes in color or size. Tell your health care provider if any changes occur. You may need to avoid activities that  increase pressure on your hernia. Do not lift anything that is heavier than 10 lb (4.5 kg) until your health care provider says that this is safe. Take over-the-counter and prescription medicines only as told by your health care provider. Keep all follow-up visits as told by your health care provider. This is important. Contact a health care  provider if: Your hernia gets larger. Your hernia becomes painful. Get help right away if: You develop sudden, severe pain near the area of your hernia. You have pain as well as nausea or vomiting. You have pain and the skin over your hernia changes color. You develop a fever. This information is not intended to replace advice given to you by your health care provider. Make sure you discuss any questions you have with your health care provider. Document Released: 09/18/2015 Document Revised: 12/20/2015 Document Reviewed: 09/18/2015 Elsevier Interactive Patient Education  [employer].

## 2021-05-10 NOTE — H&P (View-Only) (Signed)
°  05/10/2021 ° °History of Present Illness: °Gavin Jimenez is a 56 y.o. male presenting for H&P update in preparation for surgery.  He is scheduled for a robotic assisted incarcerated umbilical hernia repair on 05/20/21.  He denies any new or worsening symptoms.  His hernia remains incarcerated but without any pain or discomfort.  Denies any chest pain, shortness of breath, nausea, vomiting, constipation, diarrhea. ° °Past Medical History: °Past Medical History:  °Diagnosis Date  ° Cigarette smoker   ° Hypercholesteremia   ° Hypertension   ° Umbilical hernia   °  ° °Past Surgical History: °Past Surgical History:  °Procedure Laterality Date  ° FLEXIBLE SIGMOIDOSCOPY  02/27/1996  ° TONSILLECTOMY    ° ° °Home Medications: °Prior to Admission medications   °Medication Sig Start Date End Date Taking? Authorizing Provider  °amLODipine (NORVASC) 10 MG tablet Take 10 mg by mouth daily. 01/21/21  Yes [provider]  °buPROPion (WELLBUTRIN SR) 150 MG 12 hr tablet Take 150 mg by mouth 2 (two) times daily. 12/09/20  Yes [provider]  °simvastatin (ZOCOR) 20 MG tablet Take 40 mg by mouth at bedtime. 03/26/21  Yes [provider]  °valsartan (DIOVAN) 80 MG tablet Take 80 mg by mouth daily. 01/26/21  Yes [provider]  ° ° °Allergies: °Allergies  °Allergen Reactions  ° Ace Inhibitors Cough  ° Nickel Rash  ° ° °Review of Systems: °Review of Systems  °Constitutional:  Negative for chills and fever.  °Respiratory:  Negative for shortness of breath.   °Cardiovascular:  Negative for chest pain.  °Gastrointestinal:  Negative for abdominal pain, nausea and vomiting.  ° °Physical Exam °BP (!) 158/91    Pulse 93    Temp 97.8 °F (36.6 °C)    Ht 5' 9" (1.753 m)    Wt 207 lb (93.9 kg)    SpO2 98%    BMI 30.57 kg/m²  °CONSTITUTIONAL: No acute distress, well nourished. °HEENT:  Normocephalic, atraumatic, extraocular motion intact. °RESPIRATORY:  Normal respiratory effort without pathologic use of accessory  muscles. °CARDIOVASCULAR: Regular rhythm and rate. °GI: The abdomen is soft, non-distended, non-tender to palpation.  The patient has an incarcerated umbilical hernia, no tenderness when trying to push on it.  No overlying skin changes.  Defect is probably about 2 cm in size.  °NEUROLOGIC:  Motor and sensation is grossly normal.  Cranial nerves are grossly intact. °PSYCH:  Alert and oriented to person, place and time. Affect is normal. ° °Labs/Imaging: °None new ° °Assessment and Plan: °This is a 56 y.o. male with an incarcerated umbilical hernia ° °--Patient is scheduled for a robotic assisted incarcerated umbilical hernia repair on 05/20/21.  There are no new findings and no new medical issues.  Discussed with him the surgery at length again and all of his questions have been answered. °--He is scheduled for preop testing tomorrow. ° °I spent 15 minutes dedicated to the care of this patient on the date of this encounter to include pre-visit review of records, face-to-face time with the patient discussing diagnosis and management, and any post-visit coordination of care. ° ° °Farrie Sann Luis Ulyses Panico, MD °[facility] ° ° °  °

## 2021-05-11 ENCOUNTER — Other Ambulatory Visit
Admission: RE | Admit: 2021-05-11 | Discharge: 2021-05-11 | Disposition: A | Discharge status: Home / Self Care | Payer: Commercial Managed Care - PPO | Source: Ambulatory Visit | Attending: Surgery | Admitting: Surgery

## 2021-05-11 VITALS — Ht 70.0 in | Wt 207.0 lb

## 2021-05-11 DIAGNOSIS — I10 Essential (primary) hypertension: Secondary | ICD-10-CM

## 2021-05-11 DIAGNOSIS — E7800 Pure hypercholesterolemia, unspecified: Secondary | ICD-10-CM

## 2021-05-11 HISTORY — DX: Personal history of urinary calculi: Z87.442

## 2021-05-11 NOTE — Patient Instructions (Addendum)
Your procedure is scheduled on: Thursday May 20, 2021. Report to Day Surgery inside [facility] 2nd floor. To find out your arrival time please call [phone] between 1PM - 3PM on Wednesday May 19, 2021.  Remember: Instructions that are not followed completely may result in serious medical risk,  up to and including death, or upon the discretion of your surgeon and anesthesiologist your  surgery may need to be rescheduled.     _X__ 1. Do not eat food after midnight the night before your procedure.                 No chewing gum or hard candies. You may drink clear liquids up to 2 hours                 before you are scheduled to arrive for your surgery- DO not drink clear                 liquids within 2 hours of the start of your surgery.                 Clear Liquids include:  water, apple juice without pulp, clear Gatorade, G2 or                  Gatorade Zero (avoid Red/Purple/Blue), Black Coffee or Tea (Do not add                 anything to coffee or tea).  __X__2.  On the morning of surgery brush your teeth with toothpaste and water, you                may rinse your mouth with mouthwash if you wish.  Do not swallow any toothpaste or mouthwash.     _X__ 3.  No Alcohol for 24 hours before or after surgery.   _X__ 4.  Do Not Smoke or use e-cigarettes For 24 Hours Prior to Your Surgery.                 Do not use any chewable tobacco products for at least 6 hours prior to                 Surgery.  _X__  5.  Do not use any recreational drugs (marijuana, cocaine, heroin, ecstasy, MDMA or other)                For at least one week prior to your surgery.  Combination of these drugs with anesthesia                May have life threatening results.  ____  6.  Bring all medications with you on the day of surgery if instructed.   __X__  7.  Notify your doctor if there is any change in your medical condition      (cold, fever, infections).     Do not  wear jewelry, make-up, hairpins, clips or nail polish. Do not wear lotions, powders, or perfumes. You may wear deodorant. Do not shave 48 hours prior to surgery. Men may shave face and neck. Do not bring valuables to the hospital.    [facility] is not responsible for any belongings or valuables.  Contacts, dentures or bridgework may not be worn into surgery. Leave your suitcase in the car. After surgery it may be brought to your room. For patients admitted to the hospital, discharge time is determined by your treatment team.   Patients discharged  the day of surgery will not be allowed to drive home.   Make arrangements for someone to be with you for the first 24 hours of your Same Day Discharge.  __X__ Take these medicines the morning of surgery with A SIP OF WATER:    1. amLODipine (NORVASC) 10 MG   2. buPROPion (WELLBUTRIN SR) 150   3.   4.  5.  6.  ____ Fleet Enema (as directed)   __X__ Use CHG Soap (or wipes) as directed  ____ Use Benzoyl Peroxide Gel as instructed  ____ Use inhalers on the day of surgery  ____ Stop metformin 2 days prior to surgery    ____ Take 1/2 of usual insulin dose the night before surgery. No insulin the morning          of surgery.   ____ Call your PCP, cardiologist, or Pulmonologist if taking Coumadin/Plavix/aspirin and ask when to stop before your surgery.   __X__ One Week prior to surgery- Stop Anti-inflammatories such as Ibuprofen, Aleve, Advil, Motrin, meloxicam (MOBIC), diclofenac, etodolac, ketorolac, Toradol, Daypro, piroxicam, Goody's or BC powders. OK TO USE TYLENOL IF NEEDED   __X__ One week prior to your surgery stop ALL supplements until after surgery.    ____ Bring C-Pap to the hospital.    If you have any questions regarding your pre-procedure instructions,  Please call Pre-admit Testing at [phone]

## 2021-05-13 ENCOUNTER — Other Ambulatory Visit
Admission: RE | Admit: 2021-05-13 | Discharge: 2021-05-13 | Disposition: A | Discharge status: Home / Self Care | Payer: Commercial Managed Care - PPO | Source: Ambulatory Visit | Attending: Surgery | Admitting: Surgery

## 2021-05-13 ENCOUNTER — Other Ambulatory Visit: Payer: Self-pay

## 2021-05-13 DIAGNOSIS — Z01818 Encounter for other preprocedural examination: Secondary | ICD-10-CM | POA: Diagnosis present

## 2021-05-13 DIAGNOSIS — I10 Essential (primary) hypertension: Secondary | ICD-10-CM | POA: Insufficient documentation

## 2021-05-13 DIAGNOSIS — E7800 Pure hypercholesterolemia, unspecified: Secondary | ICD-10-CM | POA: Diagnosis not present

## 2021-05-13 LAB — CBC
HCT: 45.6 % (ref 39.0–52.0)
Hemoglobin: 14.9 g/dL (ref 13.0–17.0)
MCH: 30.8 pg (ref 26.0–34.0)
MCHC: 32.7 g/dL (ref 30.0–36.0)
MCV: 94.4 fL (ref 80.0–100.0)
Platelets: 208 10*3/uL (ref 150–400)
RBC: 4.83 MIL/uL (ref 4.22–5.81)
RDW: 13.2 % (ref 11.5–15.5)
WBC: 8.6 10*3/uL (ref 4.0–10.5)
nRBC: 0 % (ref 0.0–0.2)

## 2021-05-19 MED ORDER — CEFAZOLIN SODIUM-DEXTROSE 2-4 GM/100ML-% IV SOLN: 2.0000 g | INTRAVENOUS | Status: DC

## 2021-05-19 MED ORDER — CHLORHEXIDINE GLUCONATE 0.12 % MT SOLN: 15.0000 mL | Freq: Once | OROMUCOSAL | Status: AC

## 2021-05-19 MED ORDER — GABAPENTIN 300 MG PO CAPS: 300.0000 mg | ORAL_CAPSULE | ORAL | Status: AC

## 2021-05-19 MED ORDER — ACETAMINOPHEN 500 MG PO TABS: 1000.0000 mg | ORAL_TABLET | ORAL | Status: AC

## 2021-05-19 MED ORDER — FAMOTIDINE 20 MG PO TABS: 20.0000 mg | ORAL_TABLET | Freq: Once | ORAL | Status: AC

## 2021-05-19 MED ORDER — CHLORHEXIDINE GLUCONATE CLOTH 2 % EX PADS: 6.0000 | MEDICATED_PAD | Freq: Once | CUTANEOUS | Status: DC

## 2021-05-19 MED ORDER — BUPIVACAINE LIPOSOME 1.3 % IJ SUSP: 20.0000 mL | Freq: Once | INTRAMUSCULAR | Status: DC

## 2021-05-19 MED ORDER — ORAL CARE MOUTH RINSE: 15.0000 mL | Freq: Once | OROMUCOSAL | Status: AC

## 2021-05-19 MED ORDER — CHLORHEXIDINE GLUCONATE CLOTH 2 % EX PADS
6.0000 | MEDICATED_PAD | Freq: Once | CUTANEOUS | Status: AC
  Administered 2021-05-20: 6 via TOPICAL

## 2021-05-19 MED ORDER — LACTATED RINGERS IV SOLN: INTRAVENOUS | Status: DC

## 2021-05-20 ENCOUNTER — Encounter
Admission: RE | Disposition: A | Discharge status: Home / Self Care | Payer: Self-pay | Source: Home / Self Care | Attending: Surgery

## 2021-05-20 ENCOUNTER — Ambulatory Visit: Payer: Commercial Managed Care - PPO | Admitting: Anesthesiology

## 2021-05-20 ENCOUNTER — Encounter: Payer: Self-pay | Admitting: Surgery

## 2021-05-20 ENCOUNTER — Ambulatory Visit
Admission: RE | Admit: 2021-05-20 | Discharge: 2021-05-20 | Disposition: A | Discharge status: Home / Self Care | Payer: Commercial Managed Care - PPO | Attending: Surgery | Admitting: Surgery

## 2021-05-20 ENCOUNTER — Other Ambulatory Visit: Payer: Self-pay

## 2021-05-20 DIAGNOSIS — U071 COVID-19: Secondary | ICD-10-CM | POA: Diagnosis not present

## 2021-05-20 DIAGNOSIS — Z538 Procedure and treatment not carried out for other reasons: Secondary | ICD-10-CM | POA: Insufficient documentation

## 2021-05-20 DIAGNOSIS — K420 Umbilical hernia with obstruction, without gangrene: Secondary | ICD-10-CM | POA: Diagnosis not present

## 2021-05-20 LAB — SARS CORONAVIRUS 2 BY RT PCR (HOSPITAL ORDER, PERFORMED IN ~~LOC~~ HOSPITAL LAB): SARS Coronavirus 2: POSITIVE — AB

## 2021-05-20 SURGERY — REPAIR, HERNIA, UMBILICAL, ROBOT-ASSISTED
Anesthesia: General

## 2021-05-20 MED ORDER — CHLORHEXIDINE GLUCONATE 0.12 % MT SOLN
OROMUCOSAL | Status: AC
  Administered 2021-05-20: 15 mL via OROMUCOSAL
  Filled 2021-05-20: qty 15

## 2021-05-20 MED ORDER — GABAPENTIN 300 MG PO CAPS
ORAL_CAPSULE | ORAL | Status: AC
  Administered 2021-05-20: 300 mg via ORAL
  Filled 2021-05-20: qty 1

## 2021-05-20 MED ORDER — FAMOTIDINE 20 MG PO TABS
ORAL_TABLET | ORAL | Status: AC
  Administered 2021-05-20: 20 mg via ORAL
  Filled 2021-05-20: qty 1

## 2021-05-20 MED ORDER — CEFAZOLIN SODIUM-DEXTROSE 2-4 GM/100ML-% IV SOLN
INTRAVENOUS | Status: AC
  Filled 2021-05-20: qty 100

## 2021-05-20 MED ORDER — ACETAMINOPHEN 500 MG PO TABS
ORAL_TABLET | ORAL | Status: AC
  Administered 2021-05-20: 1000 mg via ORAL
  Filled 2021-05-20: qty 2

## 2021-05-20 MED ORDER — 0.9 % SODIUM CHLORIDE (POUR BTL) OPTIME
TOPICAL | Status: DC | PRN
  Administered 2021-05-20: 50 mL

## 2021-05-20 SURGICAL SUPPLY — 51 items
BLADE SURG SZ11 CARB STEEL (BLADE) IMPLANT
CANNULA REDUC XI 12-8 STAPL (CANNULA)
CANNULA REDUCER 12-8 DVNC XI (CANNULA) IMPLANT
CHLORAPREP W/TINT 26 (MISCELLANEOUS) IMPLANT
COVER TIP SHEARS 8 DVNC (MISCELLANEOUS) IMPLANT
COVER TIP SHEARS 8MM DA VINCI (MISCELLANEOUS)
DEFOGGER SCOPE WARMER CLEARIFY (MISCELLANEOUS) IMPLANT
DERMABOND ADVANCED (GAUZE/BANDAGES/DRESSINGS)
DERMABOND ADVANCED .7 DNX12 (GAUZE/BANDAGES/DRESSINGS) IMPLANT
DRAPE ARM DVNC X/XI (DISPOSABLE) IMPLANT
DRAPE COLUMN DVNC XI (DISPOSABLE) IMPLANT
DRAPE DA VINCI XI ARM (DISPOSABLE)
DRAPE DA VINCI XI COLUMN (DISPOSABLE)
ELECT CAUTERY BLADE TIP 2.5 (TIP)
ELECT REM PT RETURN 9FT ADLT (ELECTROSURGICAL)
ELECTRODE CAUTERY BLDE TIP 2.5 (TIP) IMPLANT
ELECTRODE REM PT RTRN 9FT ADLT (ELECTROSURGICAL) IMPLANT
GAUZE 4X4 16PLY ~~LOC~~+RFID DBL (SPONGE) IMPLANT
GLOVE SURG SYN 7.0 (GLOVE) IMPLANT
GLOVE SURG SYN 7.5  E (GLOVE)
GLOVE SURG SYN 7.5 E (GLOVE) IMPLANT
GOWN STRL REUS W/ TWL LRG LVL3 (GOWN DISPOSABLE) IMPLANT
GOWN STRL REUS W/TWL LRG LVL3 (GOWN DISPOSABLE)
GRASPER SUT TROCAR 14GX15 (MISCELLANEOUS) IMPLANT
KIT PINK PAD W/HEAD ARE REST (MISCELLANEOUS)
KIT PINK PAD W/HEAD ARM REST (MISCELLANEOUS) IMPLANT
MANIFOLD NEPTUNE II (INSTRUMENTS) IMPLANT
MESH VENT LT ST 10.2X15.2CM EL (Mesh General) IMPLANT
MESH VENT LT ST 11.4CM CRL (Mesh General) IMPLANT
NEEDLE HYPO 22GX1.5 SAFETY (NEEDLE) IMPLANT
NEEDLE INSUFFLATION 14GA 120MM (NEEDLE) IMPLANT
NS IRRIG 500ML POUR BTL (IV SOLUTION) IMPLANT
OBTURATOR OPTICAL STANDARD 8MM (TROCAR)
OBTURATOR OPTICAL STND 8 DVNC (TROCAR)
OBTURATOR OPTICALSTD 8 DVNC (TROCAR) IMPLANT
PACK LAP CHOLECYSTECTOMY (MISCELLANEOUS) IMPLANT
PENCIL ELECTRO HAND CTR (MISCELLANEOUS) IMPLANT
SEAL CANN UNIV 5-8 DVNC XI (MISCELLANEOUS) IMPLANT
SEAL XI 5MM-8MM UNIVERSAL (MISCELLANEOUS)
SET TUBE SMOKE EVAC HIGH FLOW (TUBING) IMPLANT
SOLUTION ELECTROLUBE (MISCELLANEOUS) IMPLANT
STAPLER CANNULA SEAL DVNC XI (STAPLE) IMPLANT
STAPLER CANNULA SEAL XI (STAPLE)
SUT MNCRL 4-0 (SUTURE)
SUT MNCRL 4-0 27XMFL (SUTURE)
SUT STRATAFIX PDS 30 CT-1 (SUTURE) IMPLANT
SUT VICRYL 0 AB UR-6 (SUTURE) IMPLANT
SUT VLOC 90 2/L VL 12 GS22 (SUTURE) IMPLANT
SUTURE MNCRL 4-0 27XMF (SUTURE) IMPLANT
TRAY FOLEY SLVR 16FR LF STAT (SET/KITS/TRAYS/PACK) IMPLANT
WAND RF SURG SPNG DETECT SYS (INSTRUMENTS) IMPLANT

## 2021-05-20 NOTE — Progress Notes (Signed)
05/20/21  Patient reported this morning that he was having congestion and cough.  COVID-19 test was done and resulted positive.  Discussed with him that we would have to cancel his case today as it is an elective case.  Would have to postpone for about 10 days.  Our office will contact him to reschedule.   Henrene Dodge, MD

## 2021-05-20 NOTE — Interval H&P Note (Signed)
History and Physical Interval Note:  05/20/2021 7:05 AM  Gavin Jimenez  has presented today for surgery, with the diagnosis of incarcerated umbilical hernia.  The various methods of treatment have been discussed with the patient and family. After consideration of risks, benefits and other options for treatment, the patient has consented to  Procedure(s): XI ROBOT ASSISTED UMBILICAL HERNIA REPAIR (N/A) as a surgical intervention.  The patient's history has been reviewed, patient examined, no change in status, stable for surgery.  I have reviewed the patient's chart and labs.  Questions were answered to the patient's satisfaction.     Jasdeep Kepner

## 2021-05-20 NOTE — Progress Notes (Signed)
°   05/20/21 0630  Clinical Encounter Type  Visited With Patient;Health care provider  Visit Type Initial;Pre-op  Spiritual Encounters  Spiritual Needs Prayer   Chaplain supported patient through conversation of life events, through compassionate presence and through prayer

## 2021-05-20 NOTE — Anesthesia Preprocedure Evaluation (Deleted)
Anesthesia Evaluation  Patient identified by MRN, date of birth, ID band Patient awake    Reviewed: Allergy & Precautions, NPO status , Patient's Chart, lab work & pertinent test results  History of Anesthesia Complications Negative for: history of anesthetic complications  Airway Mallampati: III   Neck ROM: Full    Dental  (+)    Pulmonary Current Smoker (1.5 ppd)Patient did not abstain from smoking.,  Cough and upper respiratory symptoms x 2 weeks   Pulmonary exam normal breath sounds clear to auscultation       Cardiovascular Exercise Tolerance: Good hypertension, Normal cardiovascular exam Rhythm:Regular Rate:Normal  ECG 05/13/21: normal   Neuro/Psych negative neurological ROS     GI/Hepatic negative GI ROS,   Endo/Other  Prediabetes   Renal/GU Renal disease (nephrolithiasis)     Musculoskeletal   Abdominal   Peds  Hematology negative hematology ROS (+)   Anesthesia Other Findings   Reproductive/Obstetrics                            Anesthesia Physical Anesthesia Plan  ASA: 2  Anesthesia Plan: General   Post-op Pain Management:    Induction: Intravenous  PONV Risk Score and Plan: 1 and Ondansetron, Dexamethasone and Treatment may vary due to age or medical condition  Airway Management Planned: Oral ETT  Additional Equipment:   Intra-op Plan:   Post-operative Plan: Extubation in OR  Informed Consent: I have reviewed the patients History and Physical, chart, labs and discussed the procedure including the risks, benefits and alternatives for the proposed anesthesia with the patient or authorized representative who has indicated his/her understanding and acceptance.     Dental advisory given  Plan Discussed with: CRNA  Anesthesia Plan Comments: (Patient consented for risks of anesthesia including but not limited to:  - adverse reactions to medications - damage to  eyes, teeth, lips or other oral mucosa - nerve damage due to positioning  - sore throat or hoarseness - damage to heart, brain, nerves, lungs, other parts of body or loss of life  Informed patient about role of CRNA in peri- and intra-operative care.  Patient voiced understanding.  Case canceled for positive COVID test.)       Anesthesia Quick Evaluation

## 2021-05-21 ENCOUNTER — Telehealth: Payer: Self-pay | Admitting: Surgery

## 2021-05-21 NOTE — Telephone Encounter (Signed)
Patient has been advised of Pre-Admission date/time, COVID Testing date and Surgery date. ° °Surgery Date: 06/08/21 °Preadmission Testing Date: 06/03/21 (phone 8a-1p) °Covid Testing Date: Not needed.   ° °Patient has been made aware to call [phone], between 1-3:00pm the day before surgery, to find out what time to arrive for surgery.   ° °

## 2021-06-03 ENCOUNTER — Inpatient Hospital Stay: Admission: RE | Admit: 2021-06-03 | Payer: Commercial Managed Care - PPO | Source: Ambulatory Visit

## 2021-06-08 ENCOUNTER — Encounter: Payer: Self-pay | Admitting: Surgery

## 2021-06-08 ENCOUNTER — Ambulatory Visit
Admission: RE | Admit: 2021-06-08 | Discharge: 2021-06-08 | Disposition: A | Discharge status: Home / Self Care | Payer: Commercial Managed Care - PPO | Attending: Surgery | Admitting: Surgery

## 2021-06-08 ENCOUNTER — Ambulatory Visit: Payer: Commercial Managed Care - PPO | Admitting: Certified Registered"

## 2021-06-08 ENCOUNTER — Encounter
Admission: RE | Disposition: A | Discharge status: Home / Self Care | Payer: Self-pay | Source: Home / Self Care | Attending: Surgery

## 2021-06-08 ENCOUNTER — Other Ambulatory Visit: Payer: Self-pay

## 2021-06-08 DIAGNOSIS — Z8616 Personal history of COVID-19: Secondary | ICD-10-CM | POA: Diagnosis not present

## 2021-06-08 DIAGNOSIS — Z87891 Personal history of nicotine dependence: Secondary | ICD-10-CM | POA: Diagnosis not present

## 2021-06-08 DIAGNOSIS — K436 Other and unspecified ventral hernia with obstruction, without gangrene: Secondary | ICD-10-CM

## 2021-06-08 DIAGNOSIS — I10 Essential (primary) hypertension: Secondary | ICD-10-CM | POA: Insufficient documentation

## 2021-06-08 DIAGNOSIS — K420 Umbilical hernia with obstruction, without gangrene: Secondary | ICD-10-CM | POA: Diagnosis not present

## 2021-06-08 SURGERY — REPAIR, HERNIA, UMBILICAL, ROBOT-ASSISTED
Anesthesia: General | Site: Abdomen

## 2021-06-08 MED ORDER — ONDANSETRON HCL 4 MG/2ML IJ SOLN
INTRAMUSCULAR | Status: DC | PRN
Start: 2021-06-08 — End: 2021-06-08
  Administered 2021-06-08: 4 mg via INTRAVENOUS

## 2021-06-08 MED ORDER — IBUPROFEN 800 MG PO TABS: 800.0000 mg | ORAL_TABLET | Freq: Three times a day (TID) | ORAL | 1 refills | Status: DC | PRN

## 2021-06-08 MED ORDER — OXYCODONE HCL 5 MG PO TABS: 5.0000 mg | ORAL_TABLET | Freq: Once | ORAL | Status: DC | PRN

## 2021-06-08 MED ORDER — CEFAZOLIN SODIUM-DEXTROSE 2-4 GM/100ML-% IV SOLN
INTRAVENOUS | Status: AC
  Filled 2021-06-08: qty 100

## 2021-06-08 MED ORDER — SUCCINYLCHOLINE CHLORIDE 200 MG/10ML IV SOSY
PREFILLED_SYRINGE | INTRAVENOUS | Status: DC | PRN
  Administered 2021-06-08: 120 mg via INTRAVENOUS

## 2021-06-08 MED ORDER — BUPIVACAINE LIPOSOME 1.3 % IJ SUSP
INTRAMUSCULAR | Status: AC
  Filled 2021-06-08: qty 20

## 2021-06-08 MED ORDER — ACETAMINOPHEN 10 MG/ML IV SOLN: 1000.0000 mg | Freq: Once | INTRAVENOUS | Status: DC | PRN

## 2021-06-08 MED ORDER — OXYCODONE HCL 5 MG/5ML PO SOLN: 5.0000 mg | Freq: Once | ORAL | Status: DC | PRN

## 2021-06-08 MED ORDER — DEXMEDETOMIDINE HCL IN NACL 200 MCG/50ML IV SOLN
INTRAVENOUS | Status: DC | PRN
  Administered 2021-06-08 (×2): 12 ug via INTRAVENOUS

## 2021-06-08 MED ORDER — BUPIVACAINE LIPOSOME 1.3 % IJ SUSP: 20.0000 mL | Freq: Once | INTRAMUSCULAR | Status: DC

## 2021-06-08 MED ORDER — SUGAMMADEX SODIUM 200 MG/2ML IV SOLN
INTRAVENOUS | Status: DC | PRN
  Administered 2021-06-08: 200 mg via INTRAVENOUS

## 2021-06-08 MED ORDER — ACETAMINOPHEN 500 MG PO TABS: 1000.0000 mg | ORAL_TABLET | ORAL | Status: AC

## 2021-06-08 MED ORDER — PROPOFOL 10 MG/ML IV BOLUS
INTRAVENOUS | Status: AC
  Filled 2021-06-08: qty 20

## 2021-06-08 MED ORDER — ORAL CARE MOUTH RINSE: 15.0000 mL | Freq: Once | OROMUCOSAL | Status: AC

## 2021-06-08 MED ORDER — FENTANYL CITRATE (PF) 100 MCG/2ML IJ SOLN: 25.0000 ug | INTRAMUSCULAR | Status: DC | PRN

## 2021-06-08 MED ORDER — KETAMINE HCL 10 MG/ML IJ SOLN
INTRAMUSCULAR | Status: DC | PRN
Start: 2021-06-08 — End: 2021-06-08
  Administered 2021-06-08: 30 mg via INTRAVENOUS
  Administered 2021-06-08: 20 mg via INTRAVENOUS

## 2021-06-08 MED ORDER — FENTANYL CITRATE (PF) 100 MCG/2ML IJ SOLN
INTRAMUSCULAR | Status: DC | PRN
  Administered 2021-06-08 (×2): 50 ug via INTRAVENOUS

## 2021-06-08 MED ORDER — CHLORHEXIDINE GLUCONATE CLOTH 2 % EX PADS
6.0000 | MEDICATED_PAD | Freq: Once | CUTANEOUS | Status: AC
  Administered 2021-06-08: 6 via TOPICAL

## 2021-06-08 MED ORDER — ONDANSETRON HCL 4 MG/2ML IJ SOLN: 4.0000 mg | Freq: Once | INTRAMUSCULAR | Status: DC | PRN

## 2021-06-08 MED ORDER — KETOROLAC TROMETHAMINE 30 MG/ML IJ SOLN
INTRAMUSCULAR | Status: DC | PRN
  Administered 2021-06-08: 15 mg via INTRAVENOUS

## 2021-06-08 MED ORDER — ACETAMINOPHEN 500 MG PO TABS
ORAL_TABLET | ORAL | Status: AC
  Administered 2021-06-08: 1000 mg via ORAL
  Filled 2021-06-08: qty 2

## 2021-06-08 MED ORDER — PROPOFOL 10 MG/ML IV BOLUS
INTRAVENOUS | Status: DC | PRN
Start: 2021-06-08 — End: 2021-06-08
  Administered 2021-06-08: 200 mg via INTRAVENOUS
  Administered 2021-06-08: 50 mg via INTRAVENOUS

## 2021-06-08 MED ORDER — CHLORHEXIDINE GLUCONATE 0.12 % MT SOLN: 15.0000 mL | Freq: Once | OROMUCOSAL | Status: AC

## 2021-06-08 MED ORDER — CHLORHEXIDINE GLUCONATE 0.12 % MT SOLN
OROMUCOSAL | Status: AC
  Administered 2021-06-08: 15 mL via OROMUCOSAL
  Filled 2021-06-08: qty 15

## 2021-06-08 MED ORDER — ACETAMINOPHEN 500 MG PO TABS
1000.0000 mg | ORAL_TABLET | Freq: Four times a day (QID) | ORAL | Status: AC | PRN
Start: 2021-06-08 — End: ?

## 2021-06-08 MED ORDER — LACTATED RINGERS IV SOLN: INTRAVENOUS | Status: DC

## 2021-06-08 MED ORDER — GABAPENTIN 300 MG PO CAPS: 300.0000 mg | ORAL_CAPSULE | ORAL | Status: AC

## 2021-06-08 MED ORDER — LIDOCAINE HCL (CARDIAC) PF 100 MG/5ML IV SOSY
PREFILLED_SYRINGE | INTRAVENOUS | Status: DC | PRN
Start: 2021-06-08 — End: 2021-06-08
  Administered 2021-06-08: 100 mg via INTRAVENOUS

## 2021-06-08 MED ORDER — DEXAMETHASONE SODIUM PHOSPHATE 10 MG/ML IJ SOLN
INTRAMUSCULAR | Status: DC | PRN
  Administered 2021-06-08: 8 mg via INTRAVENOUS

## 2021-06-08 MED ORDER — DEXAMETHASONE SODIUM PHOSPHATE 10 MG/ML IJ SOLN
INTRAMUSCULAR | Status: AC
  Filled 2021-06-08: qty 1

## 2021-06-08 MED ORDER — GABAPENTIN 300 MG PO CAPS
ORAL_CAPSULE | ORAL | Status: AC
  Administered 2021-06-08: 300 mg via ORAL
  Filled 2021-06-08: qty 1

## 2021-06-08 MED ORDER — ONDANSETRON HCL 4 MG/2ML IJ SOLN
INTRAMUSCULAR | Status: AC
  Filled 2021-06-08: qty 2

## 2021-06-08 MED ORDER — ROCURONIUM BROMIDE 100 MG/10ML IV SOLN
INTRAVENOUS | Status: DC | PRN
  Administered 2021-06-08: 20 mg via INTRAVENOUS
  Administered 2021-06-08: 50 mg via INTRAVENOUS

## 2021-06-08 MED ORDER — FENTANYL CITRATE (PF) 100 MCG/2ML IJ SOLN
INTRAMUSCULAR | Status: AC
  Filled 2021-06-08: qty 2

## 2021-06-08 MED ORDER — KETAMINE HCL 50 MG/5ML IJ SOSY
PREFILLED_SYRINGE | INTRAMUSCULAR | Status: AC
  Filled 2021-06-08: qty 5

## 2021-06-08 MED ORDER — BUPIVACAINE-EPINEPHRINE (PF) 0.5% -1:200000 IJ SOLN
INTRAMUSCULAR | Status: DC | PRN
  Administered 2021-06-08: 50 mL

## 2021-06-08 MED ORDER — CEFAZOLIN SODIUM-DEXTROSE 2-4 GM/100ML-% IV SOLN
2.0000 g | INTRAVENOUS | Status: AC
  Administered 2021-06-08: 2 g via INTRAVENOUS

## 2021-06-08 MED ORDER — OXYCODONE HCL 5 MG PO TABS: 5.0000 mg | ORAL_TABLET | ORAL | 0 refills | Status: DC | PRN

## 2021-06-08 SURGICAL SUPPLY — 57 items
ADH SKN CLS APL DERMABOND .7 (GAUZE/BANDAGES/DRESSINGS) ×1
BLADE SURG SZ11 CARB STEEL (BLADE) ×2 IMPLANT
CANNULA REDUC XI 12-8 STAPL (CANNULA) ×1
CANNULA REDUCER 12-8 DVNC XI (CANNULA) ×1 IMPLANT
COVER TIP SHEARS 8 DVNC (MISCELLANEOUS) ×1 IMPLANT
COVER TIP SHEARS 8MM DA VINCI (MISCELLANEOUS) ×1
DEFOGGER SCOPE WARMER CLEARIFY (MISCELLANEOUS) ×2 IMPLANT
DERMABOND ADVANCED (GAUZE/BANDAGES/DRESSINGS) ×1
DERMABOND ADVANCED .7 DNX12 (GAUZE/BANDAGES/DRESSINGS) ×1 IMPLANT
DRAPE ARM DVNC X/XI (DISPOSABLE) ×3 IMPLANT
DRAPE COLUMN DVNC XI (DISPOSABLE) ×1 IMPLANT
DRAPE DA VINCI XI ARM (DISPOSABLE) ×3
DRAPE DA VINCI XI COLUMN (DISPOSABLE) ×1
ELECT CAUTERY BLADE TIP 2.5 (TIP) ×2
ELECT REM PT RETURN 9FT ADLT (ELECTROSURGICAL) ×2
ELECTRODE CAUTERY BLDE TIP 2.5 (TIP) ×1 IMPLANT
ELECTRODE REM PT RTRN 9FT ADLT (ELECTROSURGICAL) ×1 IMPLANT
GLOVE SURG SYN 7.0 (GLOVE) ×4 IMPLANT
GLOVE SURG SYN 7.0 PF PI (GLOVE) ×2 IMPLANT
GLOVE SURG SYN 7.5  E (GLOVE) ×2
GLOVE SURG SYN 7.5 E (GLOVE) ×2 IMPLANT
GLOVE SURG SYN 7.5 PF PI (GLOVE) ×2 IMPLANT
GOWN STRL REUS W/ TWL LRG LVL3 (GOWN DISPOSABLE) ×3 IMPLANT
GOWN STRL REUS W/TWL LRG LVL3 (GOWN DISPOSABLE) ×6
GRASPER SUT TROCAR 14GX15 (MISCELLANEOUS) ×2 IMPLANT
IV NS 1000ML (IV SOLUTION)
IV NS 1000ML BAXH (IV SOLUTION) IMPLANT
KIT PINK PAD W/HEAD ARE REST (MISCELLANEOUS) ×2
KIT PINK PAD W/HEAD ARM REST (MISCELLANEOUS) ×1 IMPLANT
MANIFOLD NEPTUNE II (INSTRUMENTS) ×2 IMPLANT
MESH VENT LT ST 10.2X15.2CM EL (Mesh General) IMPLANT
MESH VENT LT ST 11.4CM CRL (Mesh General) ×1 IMPLANT
NDL INSUFFLATION 14GA 120MM (NEEDLE) ×1 IMPLANT
NEEDLE HYPO 22GX1.5 SAFETY (NEEDLE) ×2 IMPLANT
NEEDLE INSUFFLATION 14GA 120MM (NEEDLE) ×2 IMPLANT
NS IRRIG 500ML POUR BTL (IV SOLUTION) IMPLANT
OBTURATOR OPTICAL STANDARD 8MM (TROCAR) ×1
OBTURATOR OPTICAL STND 8 DVNC (TROCAR) ×1
OBTURATOR OPTICALSTD 8 DVNC (TROCAR) ×1 IMPLANT
PACK LAP CHOLECYSTECTOMY (MISCELLANEOUS) ×2 IMPLANT
PENCIL ELECTRO HAND CTR (MISCELLANEOUS) ×2 IMPLANT
SEAL CANN UNIV 5-8 DVNC XI (MISCELLANEOUS) ×2 IMPLANT
SEAL XI 5MM-8MM UNIVERSAL (MISCELLANEOUS) ×2
SET TUBE SMOKE EVAC HIGH FLOW (TUBING) ×2 IMPLANT
SOLUTION ELECTROLUBE (MISCELLANEOUS) ×2 IMPLANT
STAPLER CANNULA SEAL DVNC XI (STAPLE) ×1 IMPLANT
STAPLER CANNULA SEAL XI (STAPLE) ×1
SUT MNCRL 4-0 (SUTURE) ×4
SUT MNCRL 4-0 27XMFL (SUTURE) ×2
SUT MNCRL AB 4-0 PS2 18 (SUTURE) ×1 IMPLANT
SUT STRATAFIX PDS 30 CT-1 (SUTURE) ×2 IMPLANT
SUT VIC AB 3-0 SH 27 (SUTURE) ×2
SUT VIC AB 3-0 SH 27X BRD (SUTURE) IMPLANT
SUT VICRYL 0 AB UR-6 (SUTURE) ×4 IMPLANT
SUT VLOC 90 2/L VL 12 GS22 (SUTURE) ×4 IMPLANT
SUTURE MNCRL 4-0 27XMF (SUTURE) ×1 IMPLANT
WAND RF SURG SPNG DETECT SYS (INSTRUMENTS) ×2 IMPLANT

## 2021-06-08 NOTE — Op Note (Signed)
Procedure Date:  06/08/2021  Pre-operative Diagnosis:  Incarcerated umbilical hernia  Post-operative Diagnosis: Incarcerated umbilical hernia and incarcerated supraumbilical hernia, total defect length of 4 cm combined.  Procedure:  Robotic assisted Umbilical and supraumbilical Hernia Repair with mesh  Surgeon:  Howie Ill, MD  Assistant:  Darrick Penna, PA-S  Anesthesia:  General endotracheal  Estimated Blood Loss:  15 ml  Specimens:  None  Complications:  None  Indications for Procedure:  This is a 56 y.o. male who presents with an incarcerated umbilical hernia.  The options of surgery versus observation were reviewed with the patient and/or family. The risks of bleeding, abscess or infection, recurrence of symptoms, potential for an open procedure, injury to surrounding structures, and chronic pain were all discussed with the patient and was willing to proceed.  Description of Procedure: The patient was correctly identified in the preoperative area and brought into the operating room.  The patient was placed supine with VTE prophylaxis in place.  Appropriate time-outs were performed.  Anesthesia was induced and the patient was intubated.  Appropriate antibiotics were infused.  The abdomen was prepped and draped in a sterile fashion. The patient's hernia defect was marked with a marking pen.  A Veress needle was introduced in the left upper quadrant and pneumoperitoneum was obtained with appropriate pressures.  Using Optiview technique, an 8 mm port was introduced in the left lateral abdominal wall without complications.  Then, a 12 mm port was introduced in the left upper quadrant and an 8 mm port in the left lower quadrant under direct visualization.  A 4.5 inch Bard Ventralight ST Echo mesh, a ruler, a 0 Stratafix suture, and two 2-0 V-loc sutures were inserted through the 12 mm port under direct visualization.  The DaVinci platform was docked, camera targeted, and instruments  placed under direct visualization.  The patient's hernia was fully reduced and the peritoneum and preperitoneal fat were dissected and resected to allow better exposure of the hernia defect and for better mesh placement.  This exposed an additional supraumbilical hernia as well.  Both defects were measured to be a total of 4 cm in length.  The hernia defects were closed using the stratafix suture.  A PMI was brought through the center of the hernia defects and the positioning system of the mesh was passed through and insufflated.  This allowed the mesh to splay open and be centered over the repair site with good overlap.  The mesh was then sutured in place circumferentially and through the center of the mesh using the V-loc sutures.  All needles, ruler, and the positioning system were then removed through the 12 mm port without complications.  The DaVinci platform was then undocked and instruments removed.    50 ml of Exparel solution mixed with 0.5% bupivacaine with epi was infiltrated around the mesh edges, hernia repair site, and port sites.  The 12 mm port was removed and the fascia was closed under direct visualization utilizing an Endo Close technique with 0 Vicryl suture.  The 8 mm ports were removed. The 12 mm incision was closed using 3-0 Vicryl and 4-0 Monocryl, and the other port incisions were closed with 4-0 Monocryl.  The wounds were cleaned and sealed with DermaBond.  The patient was emerged from anesthesia and extubated and brought to the recovery room for further management.  The patient tolerated the procedure well and all counts were correct at the end of the case.   Howie Ill, MD [facility]

## 2021-06-08 NOTE — Anesthesia Postprocedure Evaluation (Signed)
Anesthesia Post Note  Patient: Gavin Jimenez  Procedure(s) Performed: XI ROBOT ASSISTED UMBILICAL HERNIA REPAIR (Abdomen)  Patient location during evaluation: PACU Anesthesia Type: General Level of consciousness: awake and alert, oriented and patient cooperative Pain management: pain level controlled Vital Signs Assessment: post-procedure vital signs reviewed and stable Respiratory status: spontaneous breathing, nonlabored ventilation and respiratory function stable Cardiovascular status: blood pressure returned to baseline and stable Postop Assessment: adequate PO intake Anesthetic complications: no   No notable events documented.   Last Vitals:  Vitals:   06/08/21 1338 06/08/21 1349  BP:  124/75  Pulse: 81 90  Resp: 18 18  Temp: (!) 36.2 C (!) 36.2 C  SpO2: 92% 94%    Last Pain:  Vitals:   06/08/21 1349  TempSrc: Temporal  PainSc: 0-No pain                 Reed Breech

## 2021-06-08 NOTE — Transfer of Care (Addendum)
Immediate Anesthesia Transfer of Care Note  Patient: Gavin Jimenez  Procedure(s) Performed: XI ROBOT ASSISTED UMBILICAL HERNIA REPAIR (Abdomen)  Patient Location: PACU  Anesthesia Type:General  Level of Consciousness: drowsy  Airway & Oxygen Therapy: Patient Spontanous Breathing  Post-op Assessment: Report given to RN  Post vital signs: stable  Last Vitals:  Vitals Value Taken Time  BP    Temp    Pulse    Resp    SpO2      Last Pain:  Vitals:   06/08/21 0904  TempSrc: Temporal  PainSc: 0-No pain         Complications: No notable events documented.

## 2021-06-08 NOTE — Interval H&P Note (Signed)
History and Physical Interval Note:  06/08/2021 10:13 AM  Gavin Jimenez  has presented today for surgery, with the diagnosis of incarcerated umbilical hernia.  The various methods of treatment have been discussed with the patient and family. After consideration of risks, benefits and other options for treatment, the patient has consented to  Procedure(s): XI [place] (N/A) as a surgical intervention.  The patient's history has been reviewed, patient examined, no change in status, stable for surgery.  I have reviewed the patient's chart and labs.  Questions were answered to the patient's satisfaction.     Fredonia Casalino

## 2021-06-08 NOTE — Anesthesia Procedure Notes (Signed)
Procedure Name: Intubation Date/Time: 06/08/2021 10:47 AM Performed by: Kerri Perches, CRNA Pre-anesthesia Checklist: Patient identified, Patient being monitored, Timeout performed, Emergency Drugs available and Suction available Patient Re-evaluated:Patient Re-evaluated prior to induction Oxygen Delivery Method: Circle system utilized Preoxygenation: Pre-oxygenation with 100% oxygen Induction Type: IV induction Ventilation: Mask ventilation without difficulty Laryngoscope Size: Mac and 3 Grade View: Grade III Tube type: Oral Tube size: 7.5 mm Number of attempts: 1 Airway Equipment and Method: Stylet Placement Confirmation: ETT inserted through vocal cords under direct vision, positive ETCO2 and breath sounds checked- equal and bilateral Secured at: 23 cm Tube secured with: Tape Dental Injury: Teeth and Oropharynx as per pre-operative assessment  Future Recommendations: Recommend- induction with short-acting agent, and alternative techniques readily available Comments: Cords deep and anterior. Rec Miller 3 or MAC4 OR VIDEO LARYNGOSCOPY

## 2021-06-08 NOTE — Anesthesia Preprocedure Evaluation (Addendum)
Anesthesia Evaluation  Patient identified by MRN, date of birth, ID band Patient awake    Reviewed: Allergy & Precautions, NPO status , Patient's Chart, lab work & pertinent test results  History of Anesthesia Complications Negative for: history of anesthetic complications  Airway Mallampati: IV   Neck ROM: Full    Dental   Lower right molar chipped:   Pulmonary Current Smoker (1.5 ppd)Patient did not abstain from smoking.,  COVID+ 05/20/21, residual cough and congestion   Pulmonary exam normal breath sounds clear to auscultation       Cardiovascular Exercise Tolerance: Good hypertension, Normal cardiovascular exam Rhythm:Regular Rate:Normal  ECG 05/13/21: normal   Neuro/Psych negative neurological ROS     GI/Hepatic negative GI ROS,   Endo/Other  negative endocrine ROS  Renal/GU Renal disease (nephrolithiasis)     Musculoskeletal   Abdominal   Peds  Hematology negative hematology ROS (+)   Anesthesia Other Findings   Reproductive/Obstetrics                            Anesthesia Physical Anesthesia Plan  ASA: 2  Anesthesia Plan: General   Post-op Pain Management:    Induction: Intravenous  PONV Risk Score and Plan: 1 and Ondansetron, Dexamethasone and Treatment may vary due to age or medical condition  Airway Management Planned: Oral ETT  Additional Equipment:   Intra-op Plan:   Post-operative Plan: Extubation in OR  Informed Consent: I have reviewed the patients History and Physical, chart, labs and discussed the procedure including the risks, benefits and alternatives for the proposed anesthesia with the patient or authorized representative who has indicated his/her understanding and acceptance.     Dental advisory given  Plan Discussed with: CRNA  Anesthesia Plan Comments: (Patient consented for risks of anesthesia including but not limited to:  - adverse reactions  to medications - damage to eyes, teeth, lips or other oral mucosa - nerve damage due to positioning  - sore throat or hoarseness - damage to heart, brain, nerves, lungs, other parts of body or loss of life  Informed patient about role of CRNA in peri- and intra-operative care.  Patient voiced understanding.)        Anesthesia Quick Evaluation

## 2021-06-08 NOTE — Discharge Instructions (Addendum)

## 2021-06-23 ENCOUNTER — Other Ambulatory Visit: Payer: Self-pay

## 2021-06-23 ENCOUNTER — Ambulatory Visit (INDEPENDENT_AMBULATORY_CARE_PROVIDER_SITE_OTHER): Payer: Commercial Managed Care - PPO | Admitting: Surgery

## 2021-06-23 ENCOUNTER — Encounter: Payer: Self-pay | Admitting: Surgery

## 2021-06-23 VITALS — BP 158/89 | HR 98 | Temp 98.3°F | Ht 70.0 in | Wt 208.2 lb

## 2021-06-23 DIAGNOSIS — Z09 Encounter for follow-up examination after completed treatment for conditions other than malignant neoplasm: Secondary | ICD-10-CM

## 2021-06-23 DIAGNOSIS — K420 Umbilical hernia with obstruction, without gangrene: Secondary | ICD-10-CM | POA: Diagnosis not present

## 2021-06-23 NOTE — Patient Instructions (Signed)
If you have any concerns or questions, please feel free to call our office. Follow up as needed.  ° ° °GENERAL POST-OPERATIVE °PATIENT INSTRUCTIONS  ° °WOUND CARE INSTRUCTIONS:  Keep a dry clean dressing on the wound if there is drainage. The initial bandage may be removed after 24 hours.  Once the wound has quit draining you may leave it open to air.  If clothing rubs against the wound or causes irritation and the wound is not draining you may cover it with a dry dressing during the daytime.  Try to keep the wound dry and avoid ointments on the wound unless directed to do so.  If the wound becomes bright red and painful or starts to drain infected material that is not clear, please contact your physician immediately.  If the wound is mildly pink and has a thick firm ridge underneath it, this is normal, and is referred to as a healing ridge.  This will resolve over the next 4-6 weeks. ° °BATHING: °You may shower if you have been informed of this by your surgeon. However, Please do not submerge in a tub, hot tub, or pool until incisions are completely sealed or have been told by your surgeon that you may do so. ° °DIET:  You may eat any foods that you can tolerate.  It is a good idea to eat a high fiber diet and take in plenty of fluids to prevent constipation.  If you do become constipated you may want to take a mild laxative or take ducolax tablets on a daily basis until your bowel habits are regular.  Constipation can be very uncomfortable, along with straining, after recent surgery. ° °ACTIVITY:  You are encouraged to cough and deep breath or use your incentive spirometer if you were given one, every 15-30 minutes when awake.  This will help prevent respiratory complications and low grade fevers post-operatively if you had a general anesthetic.  You may want to hug a pillow when coughing and sneezing to add additional support to the surgical area, if you had abdominal or chest surgery, which will decrease pain  during these times.  You are encouraged to walk and engage in light activity for the next two weeks.  You should not lift, push or pull more than 15-20 pounds, until 07/20/2021 as it could put you at increased risk for complications.  Twenty pounds is roughly equivalent to a plastic bag of groceries. At that time- Listen to your body when lifting, if you have pain when lifting, stop and then try again in a few days. Soreness after doing exercises or activities of daily living is normal as you get back in to your normal routine. ° °MEDICATIONS:  Try to take narcotic medications and anti-inflammatory medications, such as tylenol, ibuprofen, naprosyn, etc., with food.  This will minimize stomach upset from the medication.  Should you develop nausea and vomiting from the pain medication, or develop a rash, please discontinue the medication and contact your physician.  You should not drive, make important decisions, or operate machinery when taking narcotic pain medication. ° °SUNBLOCK °Use sun block to incision area over the next year if this area will be exposed to sun. This helps decrease scarring and will allow you avoid a permanent darkened area over your incision. ° °QUESTIONS:  Please feel free to call our office if you have any questions, and we will be glad to assist you.  ° °

## 2021-06-23 NOTE — Progress Notes (Signed)
06/23/2021  History of Present Illness: Gavin Jimenez is a 56 y.o. male s/p robotic assisted incarcerated umbilical and supraumbilical hernia repair on 06/08/21.  Patient presents for follow up.  Reports he's doing well at the hernias, but was having some discomfort at the LUQ incision.  He also noted at the small incision that was done for the Veress needle insufflation was getting irritated and opened.  He has applied a bandaid to it.  Past Medical History: Past Medical History:  Diagnosis Date   Cigarette smoker    History of kidney stones    Hypercholesteremia    Hypertension    Umbilical hernia      Past Surgical History: Past Surgical History:  Procedure Laterality Date   COLONOSCOPY  2021   FLEXIBLE SIGMOIDOSCOPY  02/27/1996   TONSILLECTOMY      Home Medications: Prior to Admission medications   Medication Sig Start Date End Date Taking? Authorizing Provider  acetaminophen (TYLENOL) 500 MG tablet Take 2 tablets (1,000 mg total) by mouth every 6 (six) hours as needed for mild pain. 06/08/21   Henrene Dodge, MD  amLODipine (NORVASC) 10 MG tablet Take 10 mg by mouth daily. 01/21/21   [provider]  buPROPion (WELLBUTRIN SR) 150 MG 12 hr tablet Take 150 mg by mouth 2 (two) times daily. 12/09/20   [provider]  ibuprofen (ADVIL) 800 MG tablet Take 1 tablet (800 mg total) by mouth every 8 (eight) hours as needed for moderate pain. 06/08/21   Henrene Dodge, MD  Omega-3 Fatty Acids (FISH OIL) 1000 MG CAPS Take by mouth.    [provider]  oxyCODONE (OXY IR/ROXICODONE) 5 MG immediate release tablet Take 1 tablet (5 mg total) by mouth every 4 (four) hours as needed for severe pain. 06/08/21   Henrene Dodge, MD  simvastatin (ZOCOR) 20 MG tablet Take 40 mg by mouth at bedtime. 03/26/21   [provider]  valsartan (DIOVAN) 80 MG tablet Take 80 mg by mouth daily. 01/26/21   [provider]  zinc gluconate 50 MG tablet Take 50 mg by mouth daily.     [provider]    Allergies: Allergies  Allergen Reactions   Ace Inhibitors Cough   Nickel Rash    Review of Systems: Review of Systems  Constitutional:  Negative for chills and fever.  Respiratory:  Negative for shortness of breath.   Cardiovascular:  Negative for chest pain.  Gastrointestinal:  Positive for abdominal pain. Negative for nausea and vomiting.  Skin:  Negative for rash.   Physical Exam BP (!) 158/89    Pulse 98    Temp 98.3 F (36.8 C) (Oral)    Ht 5\' 10"  (1.778 m)    Wt 208 lb 3.2 oz (94.4 kg)    SpO2 97%    BMI 29.87 kg/m  CONSTITUTIONAL: No acute distress, well nourished. HEENT:  Normocephalic, atraumatic, extraocular motion intact. RESPIRATORY:  Normal respiratory effort without pathologic use of accessory muscles. CARDIOVASCULAR: Regular rhythm and rate. GI: The abdomen is soft, non-distended, non-tender to palpation.  No evidence of umbilical or supraumbilical hernia recurrence.  Left sided incisions healing well, except for the LUQ veress needle incision which has opened a width of bout 3 mm.  The skin edges have a 1 mm rim of erythema which is likely just irritation.  No drainage.  Applied triple antibiotic ointment and dry gauze dressing.  NEUROLOGIC:  Motor and sensation is grossly normal.  Cranial nerves are grossly intact. PSYCH:  Alert and oriented to person, place and time. Affect is normal.  Assessment and Plan: This is a 56 y.o. male s/p robotic assisted umbilical and supraumbilical hernia repair.  --The patient's LUQ Veress needle incision opened up, but there is no evidence of infection.  As a precaution, instructed to apply neosporin or triple antibiotic at home over the skin edges and either dry gauze or BandAid dressing once daily.  Return precautions given. --The other incisions are healing well and there's no evidence of hernia recurrence. --Follow up as needed.  I spent 25 minutes dedicated to the care of this patient on the date  of this encounter to include pre-visit review of records, face-to-face time with the patient discussing diagnosis and management, and any post-visit coordination of care.   Gavin Ill, MD [facility]

## 2023-01-22 ENCOUNTER — Encounter: Payer: Self-pay | Admitting: Emergency Medicine

## 2023-01-22 ENCOUNTER — Ambulatory Visit
Admission: EM | Admit: 2023-01-22 | Discharge: 2023-01-22 | Disposition: A | Discharge status: Home / Self Care | Payer: Commercial Managed Care - PPO | Attending: Emergency Medicine | Admitting: Emergency Medicine

## 2023-01-22 ENCOUNTER — Other Ambulatory Visit: Payer: Self-pay

## 2023-01-22 DIAGNOSIS — K047 Periapical abscess without sinus: Secondary | ICD-10-CM | POA: Diagnosis not present

## 2023-01-22 MED ORDER — AMOXICILLIN-POT CLAVULANATE 875-125 MG PO TABS: 1.0000 | ORAL_TABLET | Freq: Two times a day (BID) | ORAL | 0 refills | Status: AC

## 2023-01-22 NOTE — Discharge Instructions (Addendum)
Take the Augmentin twice daily with food for 10 days for treatment of your dental infection.  Use over-the-counter Tylenol and ibuprofen for swelling and mild to moderate pain.  Rinse with warm salt water, or Listerine, after each meal to remove food particles and wash away any pus that is collecting.  If you develop any increasing or swelling, fever, pain, or difficulty swallowing you to go to the emergency department at [facility] with a have an oral surgeon and also a dentist on-call.

## 2023-01-22 NOTE — ED Provider Notes (Signed)
[facility]    CSN: [id number] Arrival date & time: 01/22/23  1023      History   Chief Complaint Chief Complaint  Patient presents with   Dental Problem    HPI Gavin Jimenez is a 57 y.o. male.   HPI  57 year old male with past medical history significant for hypertension and high cholesterol presents for evaluation of pain in his right lower jaw as well as swelling to his face that started 2 days ago.  He denies any fever or drainage.  He thinks his symptom started after a piece of food became stuck under a crown.  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 Authorizing Provider  amoxicillin-clavulanate (AUGMENTIN) 875-125 MG tablet Take 1 tablet by mouth every 12 (twelve) hours for 10 days. 01/22/23 02/01/23 Yes Becky Augusta, NP  acetaminophen (TYLENOL) 500 MG tablet Take 2 tablets (1,000 mg total) by mouth every 6 (six) hours as needed for mild pain. 06/08/21   Henrene Dodge, MD  amLODipine (NORVASC) 10 MG tablet Take 10 mg by mouth daily. 01/21/21   [provider]  buPROPion (WELLBUTRIN SR) 150 MG 12 hr tablet Take 150 mg by mouth 2 (two) times daily. 12/09/20   [provider]  Omega-3 Fatty Acids (FISH OIL) 1000 MG CAPS  Take by mouth.    [provider]  valsartan (DIOVAN) 80 MG tablet Take 80 mg by mouth daily. 01/26/21   [provider]  zinc gluconate 50 MG tablet Take 50 mg by mouth daily.    [provider]    Family History Family History  Problem Relation Age of Onset   Heart attack Mother    Diabetes Mellitus II Mother    Osteoarthritis Mother    Obesity Mother    Heart attack Father    CAD Father    Alcohol abuse Father    Hyperlipidemia Father    Hypertension Father    Osteoarthritis Father    Hyperlipidemia Maternal Grandmother    Hypertension Maternal Grandmother    Obesity Maternal Grandmother    CAD Maternal Grandmother    Glaucoma Paternal Grandfather     Social History Social History   Tobacco Use   Smoking status: Every Day    Current packs/day: 2.00    Types: Cigarettes   Smokeless tobacco: Never  Vaping Use   Vaping status: Never Used  Substance Use Topics   Alcohol use: Yes    Alcohol/week: 1.0 standard drink of alcohol    Types: 1 Glasses of wine per week    Comment: Occasionally   Drug use: No     Allergies   Ace inhibitors and Nickel   Review of Systems Review of  Systems  Constitutional:  Negative for fever.  HENT:  Positive for dental problem and facial swelling.      Physical Exam Triage Vital Signs ED Triage Vitals  Encounter Vitals Group     BP      Systolic BP Percentile      Diastolic BP Percentile      Pulse      Resp      Temp      Temp src      SpO2      Weight      Height      Head Circumference      Peak Flow      Pain Score      Pain Loc      Pain Education      Exclude from Growth Chart    No data found.  Updated Vital Signs BP (!) 164/89   Pulse 84   Temp 98.5 F (36.9 C) (Oral)   Resp 18   Ht 5\' 10"  (1.778 m)   Wt 208 lb (94.3 kg)   SpO2 96%   BMI 29.84 kg/m   Visual Acuity Right Eye Distance:   Left Eye Distance:   Bilateral Distance:    Right Eye Near:   Left Eye Near:     Bilateral Near:     Physical Exam Vitals and nursing note reviewed.  Constitutional:      Appearance: Normal appearance. He is not ill-appearing.  HENT:     Head: Normocephalic and atraumatic.     Mouth/Throat:     Mouth: Mucous membranes are moist.     Pharynx: Oropharynx is clear. No oropharyngeal exudate or posterior oropharyngeal erythema.     Comments: No pain with percussion of the canine, premolar, or molars of the right mandible.  The gum tissue on both the buccal and lingual sides is free of erythema or drainage.  Patient does have swelling that is palpable and visible externally along the right anterior lateral section of the mandible.  No submandibular or submental fullness.  Airway is patent. Skin:    General: Skin is warm and dry.     Capillary Refill: Capillary refill takes less than 2 seconds.     Findings: No erythema.  Neurological:     General: No focal deficit present.     Mental Status: He is alert and oriented to person, place, and time.      UC Treatments / Results  Labs (all labs ordered are listed, but only abnormal results are displayed) Labs Reviewed - No data to display  EKG   Radiology No results found.  Procedures Procedures (including critical care time)  Medications Ordered in UC Medications - No data to display  Initial Impression / Assessment and Plan / UC Course  I have reviewed the triage vital signs and the nursing notes.  Pertinent labs & imaging results that were available during my care of the patient were reviewed by me and considered in my medical decision making (see chart for details).   Patient is a nontoxic-appearing 57 year old male presenting for evaluation of possible dental infection along his right mandible that is been present for the last 2 days.  No fever or drainage.  He does have palpable swelling but no fluctuance along the right anterior lateral aspect of the mandible.  No submental or submandibular fullness or  tenderness.  Airway is patent.  No appreciable erythema to the gum tissue or discharge in either the buccal or  lingual side.  I will start him on Augmentin 875 twice daily for 10 days for treatment of dental infection and have him continue to monitor his swelling.  If his swelling increases, or he develops increased pain he should follow-up with his dentist.  If he is unable to get into see his dentist I have discussed having him follow-up at the ER at [facility] where they have a dental school and dentist on-call.   Final Clinical Impressions(s) / UC Diagnoses   Final diagnoses:  Dental infection     Discharge Instructions      Take the Augmentin twice daily with food for 10 days for treatment of your dental infection.  Use over-the-counter Tylenol and ibuprofen for swelling and mild to moderate pain.  Rinse with warm salt water, or Listerine, after each meal to remove food particles and wash away any pus that is collecting.  If you develop any increasing or swelling, fever, pain, or difficulty swallowing you to go to the emergency department at [facility] with a have an oral surgeon and also a dentist on-call.      ED Prescriptions     Medication Sig Dispense Auth. Provider   amoxicillin-clavulanate (AUGMENTIN) 875-125 MG tablet Take 1 tablet by mouth every 12 (twelve) hours for 10 days. 20 tablet Becky Augusta, NP      PDMP not reviewed this encounter.   Becky Augusta, NP 01/22/23 1055

## 2023-01-22 NOTE — ED Triage Notes (Addendum)
Pt started with right lower tooth pain 2 days ago.  Tooth is not hurting at this moment but has swelling to right jaw.  No fevers.  Pt reports food was stuck in crown and was trying to get out before started. Airway intact. Controlling secretions.
